# Patient Record
Sex: Male | Born: 1990 | Race: Black or African American | Hispanic: No | Marital: Single | Smoking: Never smoker
Health system: Southern US, Community
[De-identification: ages and names within clinical notes are randomized; demographics above are authoritative.]

## PROBLEM LIST (undated history)

## (undated) DIAGNOSIS — W3400XA Accidental discharge from unspecified firearms or gun, initial encounter: Secondary | ICD-10-CM

## (undated) DIAGNOSIS — Y249XXA Unspecified firearm discharge, undetermined intent, initial encounter: Secondary | ICD-10-CM

---
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
## 2012-03-30 IMAGING — CR DG FINGER THUMB 2+V*R*
3 series · 3 of 3 positions shown · non-contrast
Comparison: None

CLINICAL DATA: Right thumb laceration.  Question foreign body.

RIGHT THUMB 2+V

[x finger pa right]
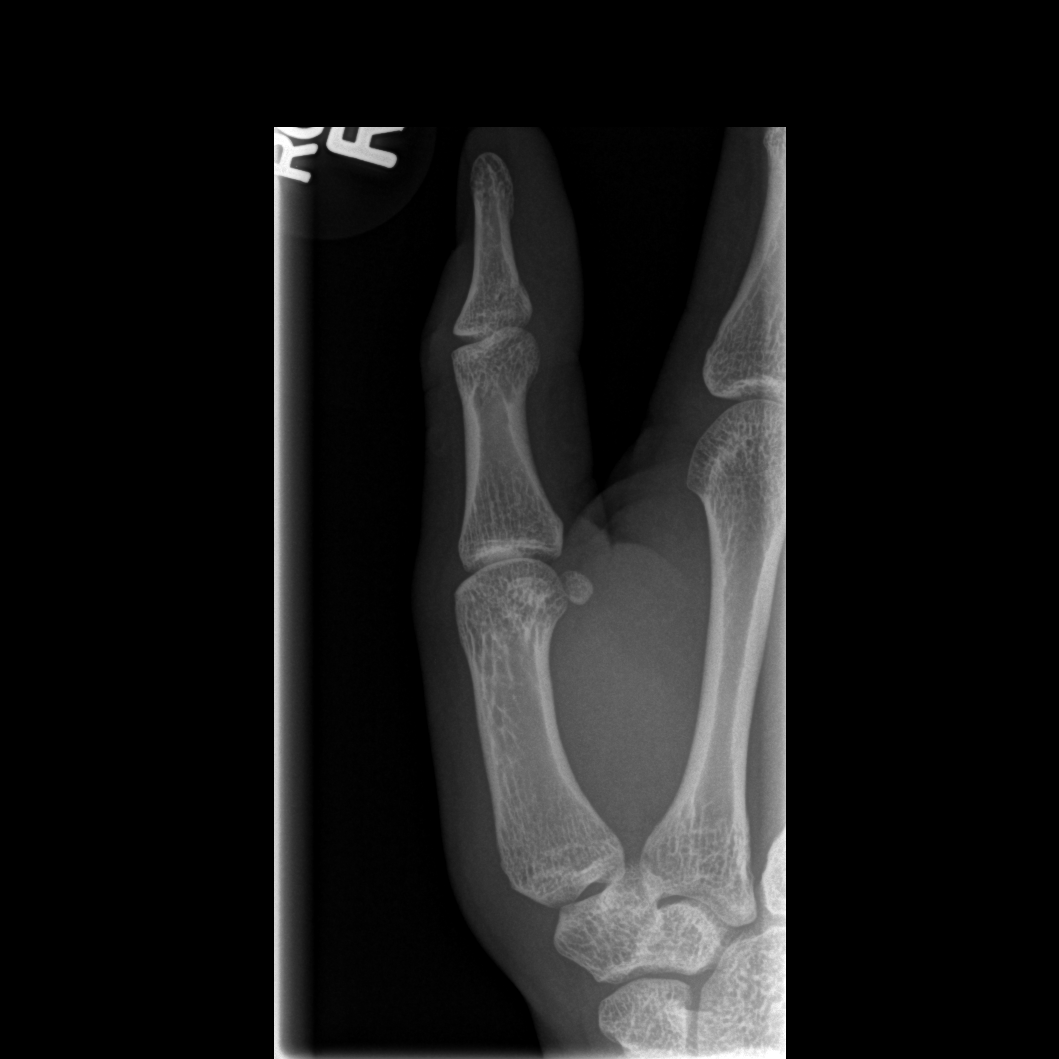

[x finger obl. right]
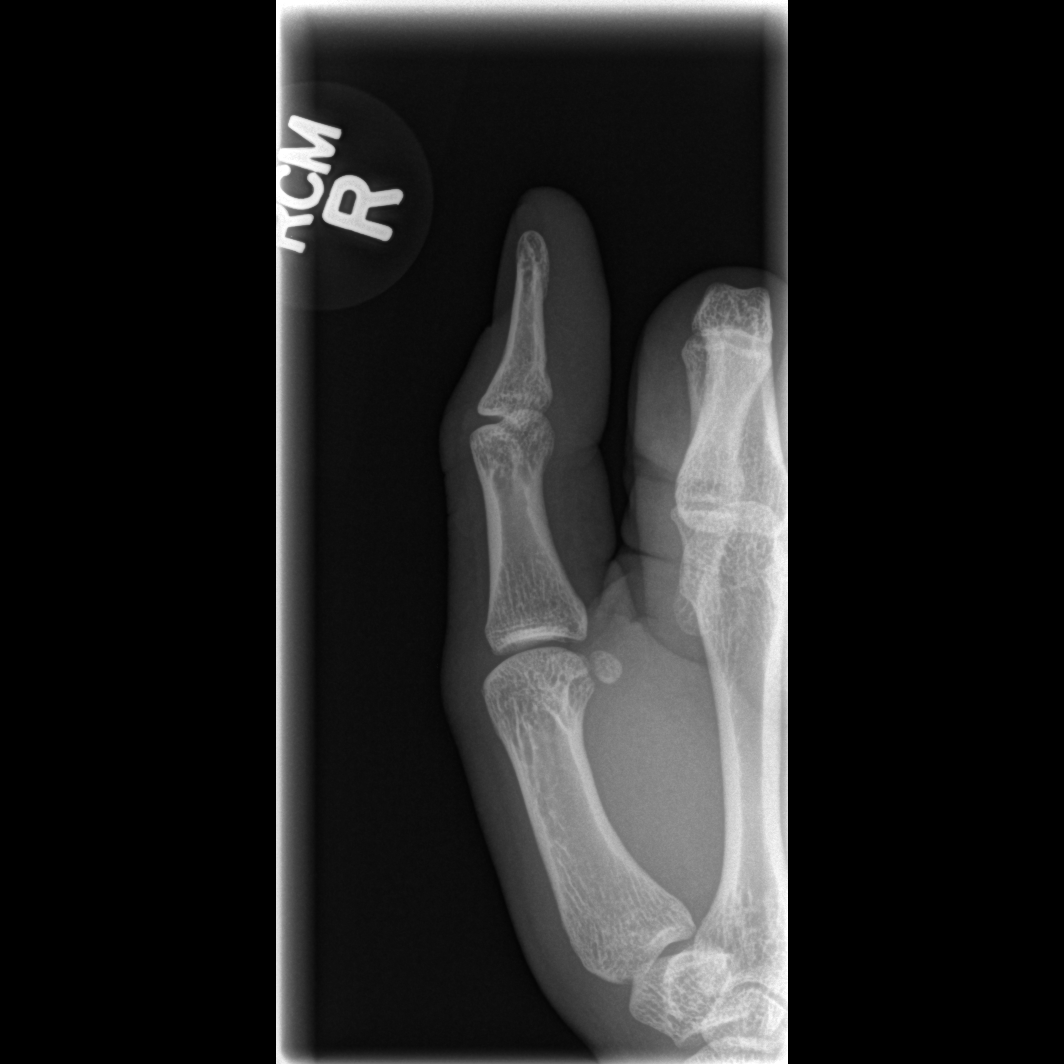

[x finger lateral right]
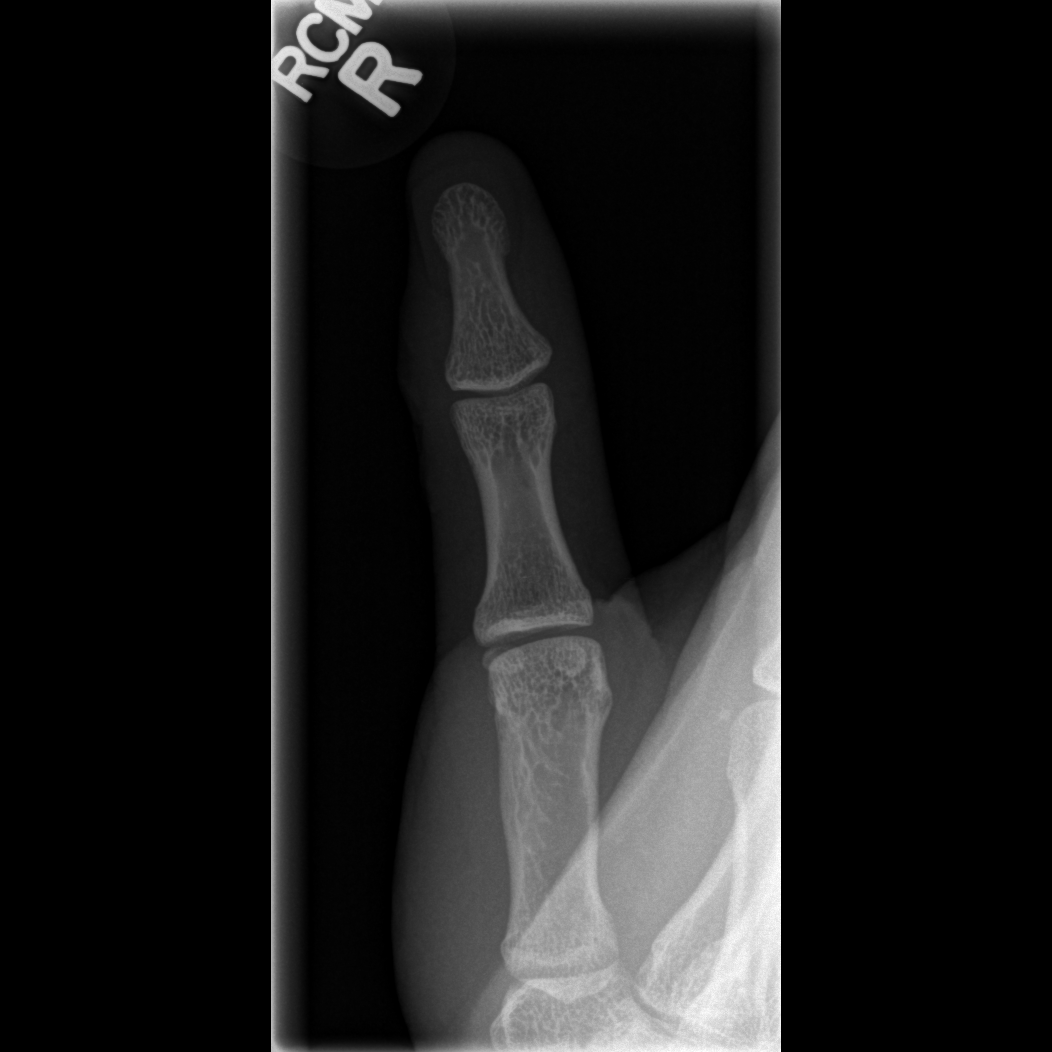

[3 of 3 positions shown; findings below may reference images not displayed]

FINDINGS: No acute bony abnormality.  Specifically, no fracture,
subluxation, or dislocation.  Soft tissues are intact.  No
radiopaque foreign bodies.
IMPRESSION: No fracture or foreign body visualized.

## 2012-11-28 ENCOUNTER — Encounter (HOSPITAL_COMMUNITY): Payer: Self-pay | Admitting: *Deleted

## 2012-11-28 ENCOUNTER — Emergency Department (INDEPENDENT_AMBULATORY_CARE_PROVIDER_SITE_OTHER)
Admission: EM | Admit: 2012-11-28 | Discharge: 2012-11-28 | Disposition: A | Discharge status: Home / Self Care | Payer: Medicaid Other | Source: Home / Self Care

## 2012-11-28 DIAGNOSIS — B86 Scabies: Secondary | ICD-10-CM

## 2012-11-28 MED ORDER — PERMETHRIN 1 % EX LOTN: TOPICAL_LOTION | Freq: Once | CUTANEOUS | Status: DC

## 2012-11-28 NOTE — ED Notes (Signed)
Pt  Reports  He  Noticed   2  Bumps  On  His  Penis  About  4  Days   -  When  Asked  If  Any  Discharge  Or  Burning  From penis  He   Denied

## 2012-11-28 NOTE — ED Provider Notes (Signed)
History     CSN: [id number]  Arrival date & time 11/28/12  1248   First MD Initiated Contact with Patient 11/28/12 1321      Chief Complaint  Patient presents with  . Sore    (Consider location/radiation/quality/duration/timing/severity/associated sxs/prior treatment) HPI This is a 22 y/o who presents with a rash on his penis that started yesterday. When examine him, he notes that it is present on other parts of the body as well - on his thigh, left wrist and in between his fingers. It is extremely itchy. He has not urethral discharge and has a stable girlfriend. He has recently been to prison.    History reviewed. No pertinent past medical history.  History reviewed. No pertinent past surgical history.  History reviewed. No pertinent family history.  History  Substance Use Topics  . Smoking status: Current Every Day Smoker  . Smokeless tobacco: Not on file  . Alcohol Use: Yes      Review of Systems  Constitutional: Negative.   HENT: Negative.   Eyes: Negative.   Respiratory: Positive for cough. Negative for apnea, choking, chest tightness, shortness of breath, wheezing and stridor.   Cardiovascular: Negative.   Gastrointestinal: Negative.   Genitourinary: Negative.   Musculoskeletal: Negative.   Skin: Negative.        Per HPI  Neurological: Negative.   Hematological: Negative.   Psychiatric/Behavioral: Negative.     Allergies  Review of patient's allergies indicates no known allergies.  Home Medications   Current Outpatient Rx  Name  Route  Sig  Dispense  Refill  . permethrin (ELIMITE) 1 % lotion   Topical   Apply topically once. Shampoo, rinse and towel dry hair, saturate hair and scalp with permethrin. Rinse after 10 min; repeat in 1 week if needed   59 mL   0     BP 138/72  Pulse 80  Temp(Src) 98.7 F (37.1 C) (Oral)  Resp 16  SpO2 97%  Physical Exam  Constitutional: He is oriented to person, place, and time. He appears well-developed and  well-nourished.  HENT:  Head: Normocephalic and atraumatic.  Eyes: Conjunctivae are normal.  Neck: Normal range of motion. Neck supple.  Cardiovascular: Normal rate and regular rhythm.   Pulmonary/Chest: Effort normal and breath sounds normal.  Abdominal: Soft. Bowel sounds are normal.  Musculoskeletal: Normal range of motion.  Neurological: He is alert and oriented to person, place, and time.  Skin: Skin is warm and dry.  Faint rash which appears to be pinpoint with crusting in some spots. 2 noted on penis, a few on right thigh, left wrist and interdigital area in between first and second fingers of left hand.     ED Course  Procedures (including critical care time)  Labs Reviewed - No data to display No results found.   1. Scabies       MDM  Permethrin ointment prescribed with advice to return if it does not resolve- general patient information regarding scabies and its management provided as well.         Calvert Cantor, MD 11/28/12 [phone]

## 2012-11-30 ENCOUNTER — Telehealth (HOSPITAL_COMMUNITY): Payer: Self-pay | Admitting: *Deleted

## 2012-11-30 NOTE — ED Notes (Signed)
Pharmacist called and said the doctor (Dr. Butler Denmark) ordered the 1 % Elimite and that is like Nix OTC with the shampoo instructions.  Chart reviewed and patient had scabies all over the body.  Discussed with Dr. Artis Flock and he changed it to the 5% Elimite cream 60 gm tube with 1 refill. Pharmacist notified if pt. and cover his body with 1/2 the tube for first dose and the other half 1 week later, they won't need to refill.  If they need the whole tube to cover the body then he can use the refill.  She voiced understanding and will explain to the pt. Kerry Harvey 11/30/2012

## 2021-08-10 ENCOUNTER — Emergency Department (HOSPITAL_COMMUNITY)
Admission: EM | Admit: 2021-08-10 | Discharge: 2021-08-11 | Disposition: A | Discharge status: Home / Self Care | Payer: Self-pay | Attending: Emergency Medicine | Admitting: Emergency Medicine

## 2021-08-10 DIAGNOSIS — U071 COVID-19: Secondary | ICD-10-CM | POA: Insufficient documentation

## 2021-08-10 DIAGNOSIS — Z20822 Contact with and (suspected) exposure to covid-19: Secondary | ICD-10-CM

## 2021-08-10 DIAGNOSIS — F17200 Nicotine dependence, unspecified, uncomplicated: Secondary | ICD-10-CM | POA: Insufficient documentation

## 2021-08-11 ENCOUNTER — Other Ambulatory Visit: Payer: Self-pay

## 2021-08-11 ENCOUNTER — Encounter (HOSPITAL_COMMUNITY): Payer: Self-pay | Admitting: *Deleted

## 2021-08-11 LAB — RESP PANEL BY RT-PCR (FLU A&B, COVID) ARPGX2
Influenza A by PCR: NEGATIVE
Influenza B by PCR: NEGATIVE
SARS Coronavirus 2 by RT PCR: POSITIVE — AB

## 2021-08-11 NOTE — ED Provider Notes (Signed)
[facility] EMERGENCY DEPARTMENT Provider Note   CSN: [id number] Arrival date & time: 08/10/21  2322     History CC: wants covid test   Kerry Harvey is a 30 y.o. male.  The history is provided by the patient and medical records.   30 y.o. M here with body aches.  Had a positive home test but wanted confirmatory test to be sure it is "on file".  Also needs work note.  No past medical history on file.  There are no problems to display for this patient.   No past surgical history on file.     No family history on file.  Social History   Tobacco Use   Smoking status: Every Day  Substance Use Topics   Alcohol use: Yes    Home Medications Prior to Admission medications   Medication Sig Start Date End Date Taking? Authorizing Provider  permethrin (ELIMITE) 1 % lotion Apply topically once. Shampoo, rinse and towel dry hair, saturate hair and scalp with permethrin. Rinse after 10 min; repeat in 1 week if needed 11/28/12   Calvert Cantor, MD    Allergies    Patient has no known allergies.  Review of Systems   Review of Systems  Musculoskeletal:  Positive for myalgias.  All other systems reviewed and are negative.  Physical Exam Updated Vital Signs BP 124/78    Pulse 71    Temp 98.3 F (36.8 C) (Oral)    Resp 13    SpO2 100%   Physical Exam Vitals and nursing note reviewed.  Constitutional:      Appearance: He is well-developed.     Comments: Smells strongly of marijuana  HENT:     Head: Normocephalic and atraumatic.  Eyes:     Conjunctiva/sclera: Conjunctivae normal.     Pupils: Pupils are equal, round, and reactive to light.  Cardiovascular:     Rate and Rhythm: Normal rate and regular rhythm.     Heart sounds: Normal heart sounds.  Pulmonary:     Effort: Pulmonary effort is normal. No respiratory distress.     Breath sounds: Normal breath sounds. No rhonchi.  Musculoskeletal:        General: Normal range of motion.     Cervical  back: Normal range of motion.  Skin:    General: Skin is warm and dry.  Neurological:     Mental Status: He is alert and oriented to person, place, and time.    ED Results / Procedures / Treatments   Labs (all labs ordered are listed, but only abnormal results are displayed) Labs Reviewed - No data to display  EKG None  Radiology No results found.  Procedures Procedures   Medications Ordered in ED Medications - No data to display  ED Course  I have reviewed the triage vital signs and the nursing notes.  Pertinent labs & imaging results that were available during my care of the patient were reviewed by me and considered in my medical decision making (see chart for details).    MDM Rules/Calculators/A&P                         30 year old male here requesting formal COVID test.  Had home test that was positive, also needs note for work.  His vitals are stable and he is overall well-appearing.  He smells incredibly strong of marijuana.  PCR was sent, given work note.  Given quarantine instructions as this is  likely going to be positive given home test was positive.  Can return here for new concerns.  Final Clinical Impression(s) / ED Diagnoses Final diagnoses:  Encounter for screening laboratory testing for COVID-19 virus    Rx / DC Orders ED Discharge Orders     None        Garlon Hatchet, PA-C 08/11/21 0013    Gilda Crease, MD 08/11/21 [phone]

## 2021-08-11 NOTE — ED Triage Notes (Signed)
Home covid test positive. Wants note for work.

## 2021-08-13 ENCOUNTER — Other Ambulatory Visit: Payer: Self-pay

## 2021-08-13 ENCOUNTER — Encounter (HOSPITAL_BASED_OUTPATIENT_CLINIC_OR_DEPARTMENT_OTHER): Payer: Self-pay | Admitting: Emergency Medicine

## 2021-08-13 ENCOUNTER — Emergency Department (HOSPITAL_BASED_OUTPATIENT_CLINIC_OR_DEPARTMENT_OTHER)
Admission: EM | Admit: 2021-08-13 | Discharge: 2021-08-13 | Disposition: A | Discharge status: Home / Self Care | Payer: Self-pay | Attending: Emergency Medicine | Admitting: Emergency Medicine

## 2021-08-13 DIAGNOSIS — F17200 Nicotine dependence, unspecified, uncomplicated: Secondary | ICD-10-CM | POA: Insufficient documentation

## 2021-08-13 DIAGNOSIS — U071 COVID-19: Secondary | ICD-10-CM | POA: Insufficient documentation

## 2021-08-13 MED ORDER — ALBUTEROL SULFATE HFA 108 (90 BASE) MCG/ACT IN AERS
2.0000 | INHALATION_SPRAY | Freq: Once | RESPIRATORY_TRACT | Status: AC
  Administered 2021-08-13: 2 via RESPIRATORY_TRACT
  Filled 2021-08-13: qty 6.7

## 2021-08-13 MED ORDER — AEROCHAMBER PLUS FLO-VU LARGE MISC
1.0000 | Freq: Once | Status: AC
  Administered 2021-08-13: 1
  Filled 2021-08-13: qty 1

## 2021-08-13 NOTE — ED Notes (Signed)
Pt educated on proper use of MDI/spacer. Pt verbalized understanding.

## 2021-08-13 NOTE — Discharge Instructions (Addendum)
You can take 1-2 puff of the inhaler every 4-6 hours as needed for shortness of breath, wheezing, or chest tightness.

## 2021-08-13 NOTE — ED Triage Notes (Signed)
°  Patient comes in stating that he is having difficulty breathing.  Patient was seen and tested positive for Covid on 12/18.  No pain but feels short of breath when being active.  Patient states he has not been taking any medications at home.  No hx of asthma.  Does smoke cigarettes/marijuana.  No pain at this time.

## 2021-08-13 NOTE — ED Provider Notes (Signed)
[facility] EMERGENCY DEPT Provider Note  CSN: [id number] Arrival date & time: 08/13/21 2307  Chief Complaint(s) Shortness of Breath  HPI Kerry Harvey is a 30 y.o. male diagnosed with COVID on 12/18 here with intermittent SOB   Shortness of Breath Severity:  Moderate Onset quality:  Gradual Timing:  Intermittent Progression:  Waxing and waning Chronicity:  New Context comment:  COVID + Relieved by:  Inhaler Worsened by:  Nothing Associated symptoms: cough   Associated symptoms: no chest pain, no fever, no headaches, no sputum production, no vomiting and no wheezing    Past Medical History History reviewed. No pertinent past medical history. There are no problems to display for this patient.  Home Medication(s) Prior to Admission medications   Medication Sig Start Date End Date Taking? Authorizing Provider  permethrin (ELIMITE) 1 % lotion Apply topically once. Shampoo, rinse and towel dry hair, saturate hair and scalp with permethrin. Rinse after 10 min; repeat in 1 week if needed 11/28/12   Calvert Cantor, MD                                                                                                                                    Past Surgical History History reviewed. No pertinent surgical history. Family History History reviewed. No pertinent family history.  Social History Social History   Tobacco Use   Smoking status: Every Day  Substance Use Topics   Alcohol use: Yes   Allergies Patient has no known allergies.  Review of Systems Review of Systems  Constitutional:  Negative for fever.  Respiratory:  Positive for cough and shortness of breath. Negative for sputum production and wheezing.   Cardiovascular:  Negative for chest pain.  Gastrointestinal:  Negative for vomiting.  Neurological:  Negative for headaches.  All other systems are reviewed and are negative for acute change except as noted in the HPI  Physical Exam Vital Signs   I have reviewed the triage vital signs BP 131/90 (BP Location: Right Arm)    Pulse 67    Temp 98.1 F (36.7 C) (Oral)    Resp 18    Ht 5\' 9"  (1.753 m)    Wt 68 kg    SpO2 100%    BMI 22.15 kg/m   Physical Exam Vitals reviewed.  Constitutional:      General: He is not in acute distress.    Appearance: He is well-developed. He is not diaphoretic.  HENT:     Head: Normocephalic and atraumatic.     Nose: Nose normal.  Eyes:     General: No scleral icterus.       Right eye: No discharge.        Left eye: No discharge.     Conjunctiva/sclera: Conjunctivae normal.     Pupils: Pupils are equal, round, and reactive to light.  Cardiovascular:     Rate and Rhythm: Normal rate and regular rhythm.  Heart sounds: No murmur heard.   No friction rub. No gallop.  Pulmonary:     Effort: Pulmonary effort is normal. No respiratory distress.     Breath sounds: Normal breath sounds. No stridor. No rales.  Abdominal:     General: There is no distension.     Palpations: Abdomen is soft.     Tenderness: There is no abdominal tenderness.  Musculoskeletal:        General: No tenderness.     Cervical back: Normal range of motion and neck supple.  Skin:    General: Skin is warm and dry.     Findings: No erythema or rash.  Neurological:     Mental Status: He is alert and oriented to person, place, and time.    ED Results and Treatments Labs (all labs ordered are listed, but only abnormal results are displayed) Labs Reviewed - No data to display                                                                                                                       EKG  EKG Interpretation  Date/Time:    Ventricular Rate:    PR Interval:    QRS Duration:   QT Interval:    QTC Calculation:   R Axis:     Text Interpretation:         Radiology No results found.  Pertinent labs & imaging results that were available during my care of the patient were reviewed by me and considered in my  medical decision making (see MDM for details).  Medications Ordered in ED Medications  albuterol (VENTOLIN HFA) 108 (90 Base) MCG/ACT inhaler 2 puff (2 puffs Inhalation Given 08/13/21 2349)  AeroChamber Plus Flo-Vu Large MISC 1 each (1 each Other Given 08/13/21 2350)                                                                                                                                     Procedures Procedures  (including critical care time)  Medical Decision Making / ED Course I have reviewed the nursing notes for this encounter and the patient's prior records (if available in EHR or on provided paperwork).  Kerry Harvey was evaluated in Emergency Department on 08/14/2021 for the symptoms described in the history of present illness. He was evaluated in the context of the global COVID-19 pandemic, which necessitated consideration that the  patient might be at risk for infection with the SARS-CoV-2 virus that causes COVID-19. Institutional protocols and algorithms that pertain to the evaluation of patients at risk for COVID-19 are in a state of rapid change based on information released by regulatory bodies including the CDC and federal and state organizations. These policies and algorithms were followed during the patient's care in the ED.     No respiratory distress. Sating well on RA. Lungs clear and equal. Currently asymptomatic.  Low suspicion for PNA, PTx, PE. Given albuterol for symptomatic relief as needed. Recommended smoking cessation.  Pertinent labs & imaging results that were available during my care of the patient were reviewed by me and considered in my medical decision making:    Final Clinical Impression(s) / ED Diagnoses Final diagnoses:  COVID-19 virus infection   The patient appears reasonably screened and/or stabilized for discharge and I doubt any other medical condition or other [facility] requiring further screening, evaluation, or treatment in the ED at  this time prior to discharge. Safe for discharge with strict return precautions.  Disposition: Discharge  Condition: Good  I have discussed the results, Dx and Tx plan with the patient/family who expressed understanding and agree(s) with the plan. Discharge instructions discussed at length. The patient/family was given strict return precautions who verbalized understanding of the instructions. No further questions at time of discharge.    ED Discharge Orders     None      Follow Up: Primary care provider  Call  to schedule an appointment for close follow up     This chart was dictated using voice recognition software.  Despite best efforts to proofread,  errors can occur which can change the documentation meaning.    Nira Conn, MD 08/14/21 [phone]

## 2021-08-25 ENCOUNTER — Other Ambulatory Visit: Payer: Self-pay

## 2021-08-25 ENCOUNTER — Encounter (HOSPITAL_BASED_OUTPATIENT_CLINIC_OR_DEPARTMENT_OTHER): Payer: Self-pay | Admitting: Obstetrics and Gynecology

## 2021-08-25 DIAGNOSIS — Z5321 Procedure and treatment not carried out due to patient leaving prior to being seen by health care provider: Secondary | ICD-10-CM | POA: Insufficient documentation

## 2021-08-25 DIAGNOSIS — R112 Nausea with vomiting, unspecified: Secondary | ICD-10-CM | POA: Insufficient documentation

## 2021-08-25 LAB — COMPREHENSIVE METABOLIC PANEL
ALT: 15 U/L (ref 0–44)
AST: 20 U/L (ref 15–41)
Albumin: 4.8 g/dL (ref 3.5–5.0)
Alkaline Phosphatase: 51 U/L (ref 38–126)
Anion gap: 10 (ref 5–15)
BUN: 15 mg/dL (ref 6–20)
CO2: 24 mmol/L (ref 22–32)
Calcium: 9.5 mg/dL (ref 8.9–10.3)
Chloride: 103 mmol/L (ref 98–111)
Creatinine, Ser: 0.87 mg/dL (ref 0.61–1.24)
GFR, Estimated: >60 mL/min (ref >60)
Glucose, Bld: 111 mg/dL — ABNORMAL HIGH (ref 70–99)
Potassium: 3.9 mmol/L (ref 3.5–5.1)
Sodium: 137 mmol/L (ref 135–145)
Total Bilirubin: 1 mg/dL (ref 0.3–1.2)
Total Protein: 8.2 g/dL — ABNORMAL HIGH (ref 6.5–8.1)

## 2021-08-25 LAB — CBC
HCT: 50.3 % (ref 39.0–52.0)
Hemoglobin: 16.9 g/dL (ref 13.0–17.0)
MCH: 28.7 pg (ref 26.0–34.0)
MCHC: 33.6 g/dL (ref 30.0–36.0)
MCV: 85.5 fL (ref 80.0–100.0)
Platelets: 240 10*3/uL (ref 150–400)
RBC: 5.88 MIL/uL — ABNORMAL HIGH (ref 4.22–5.81)
RDW: 12.4 % (ref 11.5–15.5)
WBC: 11 10*3/uL — ABNORMAL HIGH (ref 4.0–10.5)
nRBC: 0 % (ref 0.0–0.2)

## 2021-08-25 LAB — LIPASE, BLOOD: Lipase: 14 U/L (ref 11–51)

## 2021-08-25 MED ORDER — ONDANSETRON 4 MG PO TBDP
4.0000 mg | ORAL_TABLET | Freq: Once | ORAL | Status: DC | PRN
  Filled 2021-08-25: qty 1

## 2021-08-25 NOTE — ED Triage Notes (Signed)
Patient reports to the ER for nausea and emesis. Patient reports he recently had COVID 12/18 and today at 0800 started having emesis. Patient reports all food and fluid seems to come up or he has diarrhea.

## 2021-08-26 ENCOUNTER — Emergency Department (HOSPITAL_BASED_OUTPATIENT_CLINIC_OR_DEPARTMENT_OTHER)
Admission: EM | Admit: 2021-08-26 | Discharge: 2021-08-26 | Discharge status: Against Medical Advice | Payer: Self-pay | Attending: Emergency Medicine | Admitting: Emergency Medicine

## 2021-08-26 LAB — URINALYSIS, ROUTINE W REFLEX MICROSCOPIC
Bilirubin Urine: NEGATIVE
Glucose, UA: NEGATIVE mg/dL
Ketones, ur: 40 mg/dL — AB
Leukocytes,Ua: NEGATIVE
Nitrite: NEGATIVE
Protein, ur: 30 mg/dL — AB
Specific Gravity, Urine: 1.036 — ABNORMAL HIGH (ref 1.005–1.030)
pH: 5.5 (ref 5.0–8.0)

## 2023-02-10 ENCOUNTER — Emergency Department (HOSPITAL_COMMUNITY): Payer: BLUE CROSS/BLUE SHIELD | Admitting: Certified Registered"

## 2023-02-10 ENCOUNTER — Emergency Department (HOSPITAL_COMMUNITY): Payer: BLUE CROSS/BLUE SHIELD

## 2023-02-10 ENCOUNTER — Encounter (HOSPITAL_COMMUNITY): Admission: EM | Payer: Self-pay | Source: Home / Self Care

## 2023-02-10 ENCOUNTER — Other Ambulatory Visit: Payer: Self-pay

## 2023-02-10 ENCOUNTER — Inpatient Hospital Stay (HOSPITAL_COMMUNITY)
Admission: EM | Admit: 2023-02-10 | Discharge: 2023-02-16 | DRG: 958 | Payer: BLUE CROSS/BLUE SHIELD | Attending: General Surgery | Admitting: General Surgery

## 2023-02-10 ENCOUNTER — Encounter (HOSPITAL_COMMUNITY): Payer: Self-pay | Admitting: Emergency Medicine

## 2023-02-10 DIAGNOSIS — W3400XA Accidental discharge from unspecified firearms or gun, initial encounter: Secondary | ICD-10-CM

## 2023-02-10 DIAGNOSIS — S31139A Puncture wound of abdominal wall without foreign body, unspecified quadrant without penetration into peritoneal cavity, initial encounter: Principal | ICD-10-CM

## 2023-02-10 DIAGNOSIS — S36590A Other injury of ascending [right] colon, initial encounter: Principal | ICD-10-CM | POA: Diagnosis present

## 2023-02-10 DIAGNOSIS — S31633A Puncture wound without foreign body of abdominal wall, right lower quadrant with penetration into peritoneal cavity, initial encounter: Secondary | ICD-10-CM | POA: Diagnosis present

## 2023-02-10 DIAGNOSIS — S80211A Abrasion, right knee, initial encounter: Secondary | ICD-10-CM | POA: Diagnosis present

## 2023-02-10 DIAGNOSIS — S31634A Puncture wound without foreign body of abdominal wall, left lower quadrant with penetration into peritoneal cavity, initial encounter: Secondary | ICD-10-CM | POA: Diagnosis present

## 2023-02-10 DIAGNOSIS — N179 Acute kidney failure, unspecified: Secondary | ICD-10-CM | POA: Diagnosis present

## 2023-02-10 DIAGNOSIS — S32301A Unspecified fracture of right ilium, initial encounter for closed fracture: Secondary | ICD-10-CM | POA: Diagnosis present

## 2023-02-10 DIAGNOSIS — K567 Ileus, unspecified: Secondary | ICD-10-CM | POA: Diagnosis not present

## 2023-02-10 DIAGNOSIS — Z653 Problems related to other legal circumstances: Secondary | ICD-10-CM | POA: Diagnosis not present

## 2023-02-10 DIAGNOSIS — S36498A Other injury of other part of small intestine, initial encounter: Secondary | ICD-10-CM | POA: Diagnosis present

## 2023-02-10 DIAGNOSIS — D62 Acute posthemorrhagic anemia: Secondary | ICD-10-CM | POA: Diagnosis present

## 2023-02-10 DIAGNOSIS — Y249XXA Unspecified firearm discharge, undetermined intent, initial encounter: Secondary | ICD-10-CM

## 2023-02-10 HISTORY — PX: COLON RESECTION: SHX5231

## 2023-02-10 HISTORY — PX: BOWEL RESECTION: SHX1257

## 2023-02-10 HISTORY — PX: LAPAROTOMY: SHX154

## 2023-02-10 HISTORY — PX: SMALL BOWEL REPAIR: SHX6447

## 2023-02-10 LAB — I-STAT CHEM 8, ED
BUN: 14 mg/dL (ref 6–20)
Calcium, Ion: 1.07 mmol/L — ABNORMAL LOW (ref 1.15–1.40)
Chloride: 108 mmol/L (ref 98–111)
Creatinine, Ser: 1.1 mg/dL (ref 0.61–1.24)
Glucose, Bld: 153 mg/dL — ABNORMAL HIGH (ref 70–99)
HCT: 51 % (ref 39.0–52.0)
Hemoglobin: 17.3 g/dL — ABNORMAL HIGH (ref 13.0–17.0)
Potassium: 3.6 mmol/L (ref 3.5–5.1)
Sodium: 139 mmol/L (ref 135–145)
TCO2: 14 mmol/L — ABNORMAL LOW (ref 22–32)

## 2023-02-10 LAB — COMPREHENSIVE METABOLIC PANEL
ALT: 12 U/L (ref 0–44)
AST: 31 U/L (ref 15–41)
Albumin: 4.1 g/dL (ref 3.5–5.0)
Alkaline Phosphatase: 49 U/L (ref 38–126)
Anion gap: 27 — ABNORMAL HIGH (ref 5–15)
BUN: 13 mg/dL (ref 6–20)
CO2: 10 mmol/L — ABNORMAL LOW (ref 22–32)
Calcium: 8.9 mg/dL (ref 8.9–10.3)
Chloride: 102 mmol/L (ref 98–111)
Creatinine, Ser: 1.37 mg/dL — ABNORMAL HIGH (ref 0.61–1.24)
GFR, Estimated: >60 mL/min (ref >60)
Glucose, Bld: 158 mg/dL — ABNORMAL HIGH (ref 70–99)
Potassium: 3.6 mmol/L (ref 3.5–5.1)
Sodium: 139 mmol/L (ref 135–145)
Total Bilirubin: 1.4 mg/dL — ABNORMAL HIGH (ref 0.3–1.2)
Total Protein: 7.3 g/dL (ref 6.5–8.1)

## 2023-02-10 LAB — CBC
HCT: 50.3 % (ref 39.0–52.0)
HCT: 39.5 % (ref 39.0–52.0)
Hemoglobin: 16.6 g/dL (ref 13.0–17.0)
Hemoglobin: 13.5 g/dL (ref 13.0–17.0)
MCH: 30 pg (ref 26.0–34.0)
MCH: 30.8 pg (ref 26.0–34.0)
MCHC: 33 g/dL (ref 30.0–36.0)
MCHC: 34.2 g/dL (ref 30.0–36.0)
MCV: 91 fL (ref 80.0–100.0)
MCV: 90 fL (ref 80.0–100.0)
Platelets: 323 10*3/uL (ref 150–400)
Platelets: 214 10*3/uL (ref 150–400)
RBC: 5.53 MIL/uL (ref 4.22–5.81)
RBC: 4.39 MIL/uL (ref 4.22–5.81)
RDW: 12.5 % (ref 11.5–15.5)
RDW: 12.6 % (ref 11.5–15.5)
WBC: 7.7 10*3/uL (ref 4.0–10.5)
WBC: 8.6 10*3/uL (ref 4.0–10.5)
nRBC: 0 % (ref 0.0–0.2)
nRBC: 0 % (ref 0.0–0.2)

## 2023-02-10 LAB — POCT I-STAT 7, (LYTES, BLD GAS, ICA,H+H)
Acid-base deficit: 6 mmol/L — ABNORMAL HIGH (ref 0.0–2.0)
Bicarbonate: 19 mmol/L — ABNORMAL LOW (ref 20.0–28.0)
Calcium, Ion: 1.06 mmol/L — ABNORMAL LOW (ref 1.15–1.40)
HCT: 32 % — ABNORMAL LOW (ref 39.0–52.0)
Hemoglobin: 10.9 g/dL — ABNORMAL LOW (ref 13.0–17.0)
O2 Saturation: 100 %
Patient temperature: 34.7
Potassium: 4.2 mmol/L (ref 3.5–5.1)
Sodium: 139 mmol/L (ref 135–145)
TCO2: 20 mmol/L — ABNORMAL LOW (ref 22–32)
pCO2 arterial: 32.4 mmHg (ref 32–48)
pH, Arterial: 7.364 (ref 7.35–7.45)
pO2, Arterial: 357 mmHg — ABNORMAL HIGH (ref 83–108)

## 2023-02-10 LAB — PREPARE RBC (CROSSMATCH)

## 2023-02-10 LAB — TYPE AND SCREEN
ABO/RH(D): O POS
Unit division: 0

## 2023-02-10 LAB — HIV ANTIBODY (ROUTINE TESTING W REFLEX): HIV Screen 4th Generation wRfx: NONREACTIVE

## 2023-02-10 LAB — CREATININE, SERUM
Creatinine, Ser: 0.96 mg/dL (ref 0.61–1.24)
GFR, Estimated: >60 mL/min (ref >60)

## 2023-02-10 LAB — PROTIME-INR
INR: 1.3 — ABNORMAL HIGH (ref 0.8–1.2)
Prothrombin Time: 16.7 seconds — ABNORMAL HIGH (ref 11.4–15.2)

## 2023-02-10 LAB — ETHANOL: Alcohol, Ethyl (B): <10 mg/dL (ref <10)

## 2023-02-10 LAB — ABO/RH: ABO/RH(D): O POS

## 2023-02-10 SURGERY — LAPAROTOMY, EXPLORATORY
Anesthesia: General | Site: Abdomen

## 2023-02-10 MED ORDER — PIPERACILLIN-TAZOBACTAM 3.375 G IVPB: 3.3750 g | Freq: Three times a day (TID) | INTRAVENOUS | Status: DC

## 2023-02-10 MED ORDER — DEXMEDETOMIDINE HCL IN NACL 80 MCG/20ML IV SOLN
INTRAVENOUS | Status: DC | PRN
  Administered 2023-02-10: 10 ug via INTRAVENOUS
  Administered 2023-02-10: 8 ug via INTRAVENOUS
  Administered 2023-02-10: 10 ug via INTRAVENOUS

## 2023-02-10 MED ORDER — ROCURONIUM BROMIDE 10 MG/ML (PF) SYRINGE
PREFILLED_SYRINGE | INTRAVENOUS | Status: DC | PRN
  Administered 2023-02-10: 20 mg via INTRAVENOUS
  Administered 2023-02-10: 40 mg via INTRAVENOUS

## 2023-02-10 MED ORDER — FENTANYL CITRATE (PF) 100 MCG/2ML IJ SOLN
INTRAMUSCULAR | Status: AC
  Filled 2023-02-10: qty 2

## 2023-02-10 MED ORDER — SODIUM CHLORIDE 0.9 % IV SOLN: INTRAVENOUS | Status: DC

## 2023-02-10 MED ORDER — METRONIDAZOLE 500 MG/100ML IV SOLN
500.0000 mg | Freq: Once | INTRAVENOUS | Status: AC
  Administered 2023-02-10: 500 mg via INTRAVENOUS
  Filled 2023-02-10: qty 100

## 2023-02-10 MED ORDER — HYDROMORPHONE HCL 1 MG/ML IJ SOLN
0.5000 mg | INTRAMUSCULAR | Status: DC | PRN
  Administered 2023-02-10 – 2023-02-16 (×14): 1 mg via INTRAVENOUS
  Filled 2023-02-10 (×15): qty 1

## 2023-02-10 MED ORDER — FENTANYL CITRATE (PF) 250 MCG/5ML IJ SOLN
INTRAMUSCULAR | Status: AC
  Filled 2023-02-10: qty 5

## 2023-02-10 MED ORDER — ONDANSETRON HCL 4 MG/2ML IJ SOLN
INTRAMUSCULAR | Status: DC | PRN
  Administered 2023-02-10: 4 mg via INTRAVENOUS

## 2023-02-10 MED ORDER — OXYCODONE HCL 5 MG/5ML PO SOLN
5.0000 mg | Freq: Once | ORAL | Status: DC | PRN
Start: 2023-02-10 — End: 2023-02-10

## 2023-02-10 MED ORDER — DEXAMETHASONE SODIUM PHOSPHATE 10 MG/ML IJ SOLN
INTRAMUSCULAR | Status: DC | PRN
  Administered 2023-02-10: 10 mg via INTRAVENOUS

## 2023-02-10 MED ORDER — LACTATED RINGERS IV SOLN: INTRAVENOUS | Status: DC | PRN

## 2023-02-10 MED ORDER — ONDANSETRON HCL 4 MG/2ML IJ SOLN
4.0000 mg | Freq: Four times a day (QID) | INTRAMUSCULAR | Status: DC | PRN
  Administered 2023-02-11 – 2023-02-16 (×4): 4 mg via INTRAVENOUS
  Filled 2023-02-10 (×4): qty 2

## 2023-02-10 MED ORDER — OXYCODONE HCL 5 MG/5ML PO SOLN: 5.0000 mg | Freq: Once | ORAL | Status: DC | PRN

## 2023-02-10 MED ORDER — EPHEDRINE SULFATE-NACL 50-0.9 MG/10ML-% IV SOSY
PREFILLED_SYRINGE | INTRAVENOUS | Status: DC | PRN
  Administered 2023-02-10 (×2): 5 mg via INTRAVENOUS

## 2023-02-10 MED ORDER — SODIUM CHLORIDE 0.9% IV SOLUTION: Freq: Once | INTRAVENOUS | Status: DC

## 2023-02-10 MED ORDER — PROPOFOL 10 MG/ML IV BOLUS
INTRAVENOUS | Status: AC
  Filled 2023-02-10: qty 20

## 2023-02-10 MED ORDER — OXYCODONE HCL 5 MG PO TABS
5.0000 mg | ORAL_TABLET | ORAL | Status: DC | PRN
  Administered 2023-02-11: 10 mg via ORAL
  Administered 2023-02-11: 5 mg via ORAL
  Administered 2023-02-11 – 2023-02-15 (×18): 10 mg via ORAL
  Filled 2023-02-10 (×12): qty 2
  Filled 2023-02-10: qty 1
  Filled 2023-02-10 (×8): qty 2

## 2023-02-10 MED ORDER — OXYCODONE HCL 5 MG PO TABS
5.0000 mg | ORAL_TABLET | Freq: Once | ORAL | Status: DC | PRN
Start: 2023-02-10 — End: 2023-02-10

## 2023-02-10 MED ORDER — FENTANYL CITRATE PF 50 MCG/ML IJ SOSY
50.0000 ug | PREFILLED_SYRINGE | Freq: Once | INTRAMUSCULAR | Status: AC
  Filled 2023-02-10: qty 1

## 2023-02-10 MED ORDER — 0.9 % SODIUM CHLORIDE (POUR BTL) OPTIME
TOPICAL | Status: DC | PRN
  Administered 2023-02-10: 5000 mL

## 2023-02-10 MED ORDER — OXYCODONE HCL 5 MG PO TABS: 5.0000 mg | ORAL_TABLET | Freq: Once | ORAL | Status: DC | PRN

## 2023-02-10 MED ORDER — SUGAMMADEX SODIUM 200 MG/2ML IV SOLN
INTRAVENOUS | Status: DC | PRN
  Administered 2023-02-10: 200 mg via INTRAVENOUS

## 2023-02-10 MED ORDER — FENTANYL CITRATE (PF) 100 MCG/2ML IJ SOLN
25.0000 ug | INTRAMUSCULAR | Status: DC | PRN
  Administered 2023-02-10 (×3): 50 ug via INTRAVENOUS

## 2023-02-10 MED ORDER — LIDOCAINE 2% (20 MG/ML) 5 ML SYRINGE
INTRAMUSCULAR | Status: DC | PRN
  Administered 2023-02-10: 60 mg via INTRAVENOUS

## 2023-02-10 MED ORDER — PHENYLEPHRINE HCL-NACL 20-0.9 MG/250ML-% IV SOLN
INTRAVENOUS | Status: DC | PRN
  Administered 2023-02-10: 40 ug/min via INTRAVENOUS

## 2023-02-10 MED ORDER — METHOCARBAMOL 1000 MG/10ML IJ SOLN
500.0000 mg | Freq: Three times a day (TID) | INTRAVENOUS | Status: AC
  Filled 2023-02-10: qty 5

## 2023-02-10 MED ORDER — PIPERACILLIN-TAZOBACTAM 3.375 G IVPB
3.3750 g | Freq: Three times a day (TID) | INTRAVENOUS | Status: DC
  Administered 2023-02-10 – 2023-02-11 (×2): 3.375 g via INTRAVENOUS
  Filled 2023-02-10 (×5): qty 50

## 2023-02-10 MED ORDER — DOCUSATE SODIUM 100 MG PO CAPS
100.0000 mg | ORAL_CAPSULE | Freq: Two times a day (BID) | ORAL | Status: DC
  Administered 2023-02-10 – 2023-02-14 (×6): 100 mg via ORAL
  Filled 2023-02-10 (×9): qty 1

## 2023-02-10 MED ORDER — ENOXAPARIN SODIUM 30 MG/0.3ML IJ SOSY: 30.0000 mg | PREFILLED_SYRINGE | Freq: Two times a day (BID) | INTRAMUSCULAR | Status: DC

## 2023-02-10 MED ORDER — SUCCINYLCHOLINE CHLORIDE 200 MG/10ML IV SOSY
PREFILLED_SYRINGE | INTRAVENOUS | Status: DC | PRN
  Administered 2023-02-10: 120 mg via INTRAVENOUS

## 2023-02-10 MED ORDER — ONDANSETRON HCL 4 MG/2ML IJ SOLN: 4.0000 mg | Freq: Four times a day (QID) | INTRAMUSCULAR | Status: DC | PRN

## 2023-02-10 MED ORDER — FENTANYL CITRATE PF 50 MCG/ML IJ SOSY
PREFILLED_SYRINGE | INTRAMUSCULAR | Status: AC
  Administered 2023-02-10: 50 ug via INTRAVENOUS
  Filled 2023-02-10: qty 1

## 2023-02-10 MED ORDER — ALBUMIN HUMAN 5 % IV SOLN: INTRAVENOUS | Status: DC | PRN

## 2023-02-10 MED ORDER — LACTATED RINGERS IV SOLN: INTRAVENOUS | Status: DC

## 2023-02-10 MED ORDER — HYDRALAZINE HCL 20 MG/ML IJ SOLN: 10.0000 mg | INTRAMUSCULAR | Status: DC | PRN

## 2023-02-10 MED ORDER — GABAPENTIN 300 MG PO CAPS
300.0000 mg | ORAL_CAPSULE | Freq: Three times a day (TID) | ORAL | Status: DC
  Administered 2023-02-10 – 2023-02-15 (×16): 300 mg via ORAL
  Filled 2023-02-10 (×5): qty 1
  Filled 2023-02-10: qty 3
  Filled 2023-02-10 (×11): qty 1

## 2023-02-10 MED ORDER — FENTANYL CITRATE (PF) 100 MCG/2ML IJ SOLN: 25.0000 ug | INTRAMUSCULAR | Status: DC | PRN

## 2023-02-10 MED ORDER — POLYETHYLENE GLYCOL 3350 17 G PO PACK
17.0000 g | PACK | Freq: Every day | ORAL | Status: DC | PRN
  Administered 2023-02-13: 17 g via ORAL
  Filled 2023-02-10: qty 1

## 2023-02-10 MED ORDER — FENTANYL CITRATE (PF) 250 MCG/5ML IJ SOLN
INTRAMUSCULAR | Status: DC | PRN
  Administered 2023-02-10: 50 ug via INTRAVENOUS
  Administered 2023-02-10: 25 ug via INTRAVENOUS
  Administered 2023-02-10: 100 ug via INTRAVENOUS
  Administered 2023-02-10: 25 ug via INTRAVENOUS

## 2023-02-10 MED ORDER — METOPROLOL TARTRATE 5 MG/5ML IV SOLN: 5.0000 mg | Freq: Four times a day (QID) | INTRAVENOUS | Status: DC | PRN

## 2023-02-10 MED ORDER — HEMOSTATIC AGENTS (NO CHARGE) OPTIME
TOPICAL | Status: DC | PRN
  Administered 2023-02-10: 1

## 2023-02-10 MED ORDER — ACETAMINOPHEN 500 MG PO TABS
1000.0000 mg | ORAL_TABLET | Freq: Four times a day (QID) | ORAL | Status: DC
  Administered 2023-02-10 – 2023-02-16 (×20): 1000 mg via ORAL
  Filled 2023-02-10 (×22): qty 2

## 2023-02-10 MED ORDER — METHOCARBAMOL 500 MG PO TABS
500.0000 mg | ORAL_TABLET | Freq: Three times a day (TID) | ORAL | Status: AC
  Administered 2023-02-10 – 2023-02-13 (×9): 500 mg via ORAL
  Filled 2023-02-10 (×9): qty 1

## 2023-02-10 MED ORDER — IOHEXOL 350 MG/ML SOLN
75.0000 mL | Freq: Once | INTRAVENOUS | Status: AC | PRN
  Administered 2023-02-10: 75 mL via INTRAVENOUS

## 2023-02-10 MED ORDER — ONDANSETRON 4 MG PO TBDP: 4.0000 mg | ORAL_TABLET | Freq: Four times a day (QID) | ORAL | Status: DC | PRN

## 2023-02-10 MED ORDER — METRONIDAZOLE 500 MG/100ML IV SOLN
500.0000 mg | Freq: Once | INTRAVENOUS | Status: DC
  Filled 2023-02-10: qty 100

## 2023-02-10 MED ORDER — PROPOFOL 10 MG/ML IV BOLUS
INTRAVENOUS | Status: DC | PRN
  Administered 2023-02-10: 50 mg via INTRAVENOUS
  Administered 2023-02-10: 200 mg via INTRAVENOUS

## 2023-02-10 MED ORDER — ONDANSETRON HCL 4 MG/2ML IJ SOLN
4.0000 mg | Freq: Four times a day (QID) | INTRAMUSCULAR | Status: DC | PRN
Start: 2023-02-10 — End: 2023-02-10

## 2023-02-10 SURGICAL SUPPLY — 67 items
ADH SKN CLS APL DERMABOND .7 (GAUZE/BANDAGES/DRESSINGS) ×3
ADH SKNCLS APL OCTYL .7 VIOL (GAUZE/BANDAGES/DRESSINGS) ×3
APL PRP STRL LF DISP 70% ISPRP (MISCELLANEOUS) ×3
APPLIER CLIP 5 13 M/L LIGAMAX5 (MISCELLANEOUS)
APR CLP MED LRG 5 ANG JAW (MISCELLANEOUS)
BAG COUNTER SPONGE SURGICOUNT (BAG) ×4 IMPLANT
BAG SPNG CNTER NS LX DISP (BAG) ×3
BLADE CLIPPER SURG (BLADE) IMPLANT
CANISTER SUCT 3000ML PPV (MISCELLANEOUS) ×4 IMPLANT
CHLORAPREP W/TINT 26 (MISCELLANEOUS) ×4 IMPLANT
CLIP APPLIE 5 13 M/L LIGAMAX5 (MISCELLANEOUS) IMPLANT
COVER MAYO STAND STRL (DRAPES) ×1 IMPLANT
COVER SURGICAL LIGHT HANDLE (MISCELLANEOUS) ×4 IMPLANT
CUTTER FLEX LINEAR 45M (STAPLE) IMPLANT
DERMABOND ADVANCED .7 DNX12 (GAUZE/BANDAGES/DRESSINGS) ×4 IMPLANT
DRAPE HALF SHEET 40X57 (DRAPES) ×1 IMPLANT
DRSG OPSITE POSTOP 4X12 (GAUZE/BANDAGES/DRESSINGS) ×1 IMPLANT
ELECT REM PT RETURN 9FT ADLT (ELECTROSURGICAL) ×3
ELECTRODE REM PT RTRN 9FT ADLT (ELECTROSURGICAL) ×4 IMPLANT
ENDOLOOP SUT PDS II 0 18 (SUTURE) IMPLANT
GAUZE SPONGE 4X4 12PLY STRL (GAUZE/BANDAGES/DRESSINGS) ×1 IMPLANT
GLOVE BIOGEL PI MICRO STRL 5.5 (GLOVE) ×4 IMPLANT
GLOVE SURG UNDER POLY LF SZ6 (GLOVE) ×4 IMPLANT
GOWN STRL REUS W/ TWL LRG LVL3 (GOWN DISPOSABLE) ×12 IMPLANT
GOWN STRL REUS W/TWL LRG LVL3 (GOWN DISPOSABLE) ×9
HEMOSTAT SNOW SURGICEL 2X4 (HEMOSTASIS) ×1 IMPLANT
IRRIG SUCT STRYKERFLOW 2 WTIP (MISCELLANEOUS)
IRRIGATION SUCT STRKRFLW 2 WTP (MISCELLANEOUS) IMPLANT
KIT BASIN OR (CUSTOM PROCEDURE TRAY) ×4 IMPLANT
KIT TURNOVER KIT B (KITS) ×4 IMPLANT
LIGASURE IMPACT 36 18CM CVD LR (INSTRUMENTS) ×1 IMPLANT
NS IRRIG 1000ML POUR BTL (IV SOLUTION) ×4 IMPLANT
PAD ARMBOARD 7.5X6 YLW CONV (MISCELLANEOUS) ×8 IMPLANT
PENCIL BUTTON HOLSTER BLD 10FT (ELECTRODE) ×4 IMPLANT
RELOAD PROXIMATE 75MM BLUE (ENDOMECHANICALS) ×21 IMPLANT
RELOAD PROXIMATE TA60MM BLUE (ENDOMECHANICALS) ×3 IMPLANT
RELOAD STAPLE 45 3.5 BLU ETS (ENDOMECHANICALS) IMPLANT
RELOAD STAPLE 60 BLU REG PROX (ENDOMECHANICALS) IMPLANT
RELOAD STAPLE 75 3.8 BLU REG (ENDOMECHANICALS) IMPLANT
RELOAD STAPLE TA45 3.5 REG BLU (ENDOMECHANICALS) IMPLANT
RETRACTOR WND ALEXIS 25 LRG (MISCELLANEOUS) ×1 IMPLANT
RTRCTR WOUND ALEXIS 25CM LRG (MISCELLANEOUS) ×3
SCISSORS LAP 5X35 DISP (ENDOMECHANICALS) IMPLANT
SET TUBE SMOKE EVAC HIGH FLOW (TUBING) ×4 IMPLANT
SHEARS HARMONIC ACE PLUS 36CM (ENDOMECHANICALS) IMPLANT
SLEEVE Z-THREAD 5X100MM (TROCAR) ×4 IMPLANT
SPECIMEN JAR SMALL (MISCELLANEOUS) ×4 IMPLANT
SPONGE T-LAP 18X18 ~~LOC~~+RFID (SPONGE) ×4 IMPLANT
STAPLER GUN LINEAR PROX 60 (STAPLE) ×1 IMPLANT
STAPLER PROXIMATE 75MM BLUE (STAPLE) ×1 IMPLANT
SUT MNCRL AB 4-0 PS2 18 (SUTURE) ×3 IMPLANT
SUT PDS AB 1 TP1 96 (SUTURE) ×3 IMPLANT
SUT SILK 2 0 SH CR/8 (SUTURE) ×1 IMPLANT
SUT SILK 3 0 SH CR/8 (SUTURE) ×3 IMPLANT
SYS BAG RETRIEVAL 10MM (BASKET) ×3
SYSTEM BAG RETRIEVAL 10MM (BASKET) ×4 IMPLANT
TAPE CLOTH SURG 6X10 WHT LF (GAUZE/BANDAGES/DRESSINGS) ×1 IMPLANT
TOWEL GREEN STERILE (TOWEL DISPOSABLE) ×4 IMPLANT
TOWEL GREEN STERILE FF (TOWEL DISPOSABLE) ×4 IMPLANT
TRAY FOLEY MTR SLVR 16FR STAT (SET/KITS/TRAYS/PACK) ×1 IMPLANT
TRAY FOLEY W/BAG SLVR 14FR (SET/KITS/TRAYS/PACK) IMPLANT
TRAY LAPAROSCOPIC MC (CUSTOM PROCEDURE TRAY) ×4 IMPLANT
TROCAR BALLN 12MMX100 BLUNT (TROCAR) ×4 IMPLANT
TROCAR Z-THREAD OPTICAL 5X100M (TROCAR) ×4 IMPLANT
TUBE CONNECTING 12X1/4 (SUCTIONS) ×1 IMPLANT
WARMER LAPAROSCOPE (MISCELLANEOUS) ×4 IMPLANT
WATER STERILE IRR 1000ML POUR (IV SOLUTION) ×4 IMPLANT

## 2023-02-10 NOTE — Progress Notes (Signed)
Per [employer], no visitors at this time unless cleared by them.
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
## 2023-02-10 NOTE — Transfer of Care (Signed)
Immediate Anesthesia Transfer of Care Note  Patient: Kerry Harvey  Procedure(s) Performed: EXPLORATORY LAPAROTOMY SMALL BOWEL RESECTION (Abdomen) SMALL BOWEL REPAIR (Abdomen) COLON RESECTION WITH ANASTOMOSIS (Abdomen)  Patient Location: PACU  Anesthesia Type:General  Level of Consciousness: awake  Airway & Oxygen Therapy: Patient Spontanous Breathing  Post-op Assessment: Report given to RN and Post -op Vital signs reviewed and stable  Post vital signs: Reviewed and stable  Last Vitals:  Vitals Value Taken Time  BP 134/79 02/10/23 1508  Temp 36.6 C 02/10/23 1508  Pulse 98 02/10/23 1513  Resp 21 02/10/23 1513  SpO2 96 % 02/10/23 1513  Vitals shown include unvalidated device data.  Last Pain:  Vitals:   02/10/23 1508  TempSrc:   PainSc: 0-No pain         Complications: No notable events documented.

## 2023-02-10 NOTE — Anesthesia Procedure Notes (Signed)
Procedure Name: Intubation Date/Time: 02/10/2023 12:50 PM  Performed by: Cheree Ditto, CRNAPre-anesthesia Checklist: Patient identified, Emergency Drugs available, Suction available and Patient being monitored Patient Re-evaluated:Patient Re-evaluated prior to induction Oxygen Delivery Method: Circle system utilized Preoxygenation: Pre-oxygenation with 100% oxygen Induction Type: IV induction, Rapid sequence and Cricoid Pressure applied Laryngoscope Size: Mac and 4 Grade View: Grade I Tube type: Oral Tube size: 8.0 mm Number of attempts: 1 Airway Equipment and Method: Stylet and Oral airway Placement Confirmation: ETT inserted through vocal cords under direct vision, positive ETCO2 and breath sounds checked- equal and bilateral Secured at: 24 cm Tube secured with: Tape Dental Injury: Teeth and Oropharynx as per pre-operative assessment

## 2023-02-10 NOTE — Anesthesia Procedure Notes (Signed)
Central Venous Catheter Insertion Performed by: Achille Rich, MD, anesthesiologist Start/End6/19/2024 12:55 PM, 02/10/2023 1:02 PM Patient location: Pre-op. Emergency situation Preanesthetic checklist: patient identified, IV checked, site marked, risks and benefits discussed, surgical consent, monitors and equipment checked, pre-op evaluation, timeout performed and anesthesia consent Position: Trendelenburg Patient sedated Hand hygiene performed , maximum sterile barriers used  and Seldinger technique used Catheter size: 7 Fr Central line was placed.Double lumen Procedure performed using ultrasound guided technique. Ultrasound Notes:anatomy identified, needle tip was noted to be adjacent to the nerve/plexus identified, no ultrasound evidence of intravascular and/or intraneural injection and image(s) printed for medical record Attempts: 1 Following insertion, line sutured, dressing applied and Biopatch. Post procedure assessment: blood return through all ports, free fluid flow and no air  Patient tolerated the procedure well with no immediate complications.

## 2023-02-10 NOTE — Anesthesia Procedure Notes (Signed)
Arterial Line Insertion Start/End6/19/2024 12:55 PM, 02/10/2023 12:58 PM Performed by: Carlos American, CRNA, CRNA  Patient location: Pre-op. Preanesthetic checklist: patient identified, IV checked, site marked, risks and benefits discussed, surgical consent, monitors and equipment checked, pre-op evaluation, timeout performed and anesthesia consent Lidocaine 1% used for infiltration Left, radial was placed Catheter size: 20 G Hand hygiene performed  and maximum sterile barriers used   Attempts: 1 Procedure performed without using ultrasound guided technique. Following insertion, dressing applied and Biopatch. Post procedure assessment: normal and unchanged  Patient tolerated the procedure well with no immediate complications.
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
## 2023-02-10 NOTE — Anesthesia Preprocedure Evaluation (Signed)
Anesthesia Evaluation  Patient identified by MRN, date of birth, ID band Patient awake    Reviewed: Allergy & Precautions, H&P , NPO status , Patient's Chart, lab work & pertinent test resultsPreop documentation limited or incomplete due to emergent nature of procedure.  Airway Mallampati: I   Neck ROM: full    Dental   Pulmonary neg pulmonary ROS   breath sounds clear to auscultation       Cardiovascular negative cardio ROS  Rhythm:regular Rate:Normal     Neuro/Psych    GI/Hepatic   Endo/Other    Renal/GU      Musculoskeletal   Abdominal   Peds  Hematology   Anesthesia Other Findings   Reproductive/Obstetrics                             Anesthesia Physical Anesthesia Plan  ASA: 2 and emergent  Anesthesia Plan: General   Post-op Pain Management:    Induction: Intravenous  PONV Risk Score and Plan: 2 and Ondansetron, Dexamethasone, Midazolam and Treatment may vary due to age or medical condition  Airway Management Planned: Oral ETT  Additional Equipment: Arterial line, CVP and Ultrasound Guidance Line Placement  Intra-op Plan:   Post-operative Plan: Extubation in OR  Informed Consent: I have reviewed the patients History and Physical, chart, labs and discussed the procedure including the risks, benefits and alternatives for the proposed anesthesia with the patient or authorized representative who has indicated his/her understanding and acceptance.     Dental advisory given  Plan Discussed with: CRNA, Anesthesiologist and Surgeon  Anesthesia Plan Comments:        Anesthesia Quick Evaluation
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
## 2023-02-11 ENCOUNTER — Encounter (HOSPITAL_COMMUNITY): Payer: Self-pay | Admitting: Surgery

## 2023-02-11 LAB — CBC
HCT: 38.5 % — ABNORMAL LOW (ref 39.0–52.0)
Hemoglobin: 13.3 g/dL (ref 13.0–17.0)
MCH: 30.3 pg (ref 26.0–34.0)
MCHC: 34.5 g/dL (ref 30.0–36.0)
MCV: 87.7 fL (ref 80.0–100.0)
Platelets: 197 10*3/uL (ref 150–400)
RBC: 4.39 MIL/uL (ref 4.22–5.81)
RDW: 12.5 % (ref 11.5–15.5)
WBC: 11 10*3/uL — ABNORMAL HIGH (ref 4.0–10.5)
nRBC: 0 % (ref 0.0–0.2)

## 2023-02-11 LAB — BASIC METABOLIC PANEL
Anion gap: 9 (ref 5–15)
BUN: 6 mg/dL (ref 6–20)
CO2: 21 mmol/L — ABNORMAL LOW (ref 22–32)
Calcium: 7.7 mg/dL — ABNORMAL LOW (ref 8.9–10.3)
Chloride: 103 mmol/L (ref 98–111)
Creatinine, Ser: 0.84 mg/dL (ref 0.61–1.24)
GFR, Estimated: >60 mL/min (ref >60)
Glucose, Bld: 111 mg/dL — ABNORMAL HIGH (ref 70–99)
Potassium: 3.8 mmol/L (ref 3.5–5.1)
Sodium: 133 mmol/L — ABNORMAL LOW (ref 135–145)

## 2023-02-11 MED ORDER — CHLORHEXIDINE GLUCONATE CLOTH 2 % EX PADS
6.0000 | MEDICATED_PAD | Freq: Every day | CUTANEOUS | Status: DC
  Administered 2023-02-11 – 2023-02-15 (×5): 6 via TOPICAL

## 2023-02-11 MED ORDER — PIPERACILLIN-TAZOBACTAM 3.375 G IVPB
3.3750 g | Freq: Three times a day (TID) | INTRAVENOUS | Status: AC
  Administered 2023-02-11 – 2023-02-15 (×12): 3.375 g via INTRAVENOUS
  Filled 2023-02-11 (×12): qty 50
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
## 2023-02-11 NOTE — Progress Notes (Signed)
RN performed wound care on the patient he tolerated okay pain medication given. Honeycomb dressing changed because it was soiled.

## 2023-02-11 NOTE — Progress Notes (Signed)
PT Cancellation Note  Patient Details Name: Kerry Harvey MRN: [id number] DOB: 09-26-1990   Cancelled Treatment:    Reason Eval/Treat Not Completed: Patient declined, no reason specified  Pre-medicated, refuses PT evaluation today. He did get up briefly with OT earlier.  Educated on importance of early mobility to prevent complications of immobility during admission. Will try again tomorrow.   Kathlyn [place], PT, DPT [facility]  Rehabilitation Services Physical Therapist Office: [phone] Website: [email or web address]   Berton Mount 02/11/2023, 1:24 PM

## 2023-02-12 ENCOUNTER — Encounter (HOSPITAL_COMMUNITY): Payer: Self-pay | Admitting: Surgery

## 2023-02-12 LAB — CBC
HCT: 33.5 % — ABNORMAL LOW (ref 39.0–52.0)
HCT: 32.7 % — ABNORMAL LOW (ref 39.0–52.0)
Hemoglobin: 11.6 g/dL — ABNORMAL LOW (ref 13.0–17.0)
Hemoglobin: 11.2 g/dL — ABNORMAL LOW (ref 13.0–17.0)
MCH: 31 pg (ref 26.0–34.0)
MCH: 29.9 pg (ref 26.0–34.0)
MCHC: 34.6 g/dL (ref 30.0–36.0)
MCHC: 34.3 g/dL (ref 30.0–36.0)
MCV: 89.6 fL (ref 80.0–100.0)
MCV: 87.4 fL (ref 80.0–100.0)
Platelets: 157 10*3/uL (ref 150–400)
Platelets: 159 10*3/uL (ref 150–400)
RBC: 3.74 MIL/uL — ABNORMAL LOW (ref 4.22–5.81)
RBC: 3.74 MIL/uL — ABNORMAL LOW (ref 4.22–5.81)
RDW: 12.7 % (ref 11.5–15.5)
RDW: 12.5 % (ref 11.5–15.5)
WBC: 12.7 10*3/uL — ABNORMAL HIGH (ref 4.0–10.5)
WBC: 12.5 10*3/uL — ABNORMAL HIGH (ref 4.0–10.5)
nRBC: 0 % (ref 0.0–0.2)
nRBC: 0 % (ref 0.0–0.2)

## 2023-02-12 LAB — TYPE AND SCREEN
Antibody Screen: NEGATIVE
Unit division: 0

## 2023-02-12 LAB — BPAM RBC
Blood Product Expiration Date: 202407142359
Blood Product Expiration Date: 202407282359
ISSUE DATE / TIME: 202406191218
ISSUE DATE / TIME: 202406191218
Unit Type and Rh: 5100
Unit Type and Rh: 5100

## 2023-02-12 LAB — BASIC METABOLIC PANEL
Anion gap: 8 (ref 5–15)
BUN: 6 mg/dL (ref 6–20)
CO2: 23 mmol/L (ref 22–32)
Calcium: 8.1 mg/dL — ABNORMAL LOW (ref 8.9–10.3)
Chloride: 102 mmol/L (ref 98–111)
Creatinine, Ser: 0.99 mg/dL (ref 0.61–1.24)
GFR, Estimated: >60 mL/min (ref >60)
Glucose, Bld: 90 mg/dL (ref 70–99)
Potassium: 3.7 mmol/L (ref 3.5–5.1)
Sodium: 133 mmol/L — ABNORMAL LOW (ref 135–145)

## 2023-02-12 LAB — SURGICAL PATHOLOGY

## 2023-02-12 MED ORDER — SILVER NITRATE-POT NITRATE 75-25 % EX MISC
1.0000 | Freq: Once | CUTANEOUS | Status: AC
  Administered 2023-02-12: 1 via TOPICAL
  Filled 2023-02-12: qty 1

## 2023-02-12 MED ORDER — ENOXAPARIN SODIUM 30 MG/0.3ML IJ SOSY
30.0000 mg | PREFILLED_SYRINGE | Freq: Two times a day (BID) | INTRAMUSCULAR | Status: DC
  Administered 2023-02-13 – 2023-02-15 (×5): 30 mg via SUBCUTANEOUS
  Filled 2023-02-12 (×6): qty 0.3

## 2023-02-12 NOTE — Anesthesia Postprocedure Evaluation (Signed)
Anesthesia Post Note  Patient: Kerry Harvey  Procedure(s) Performed: EXPLORATORY LAPAROTOMY SMALL BOWEL RESECTION (Abdomen) SMALL BOWEL REPAIR (Abdomen) COLON RESECTION WITH ANASTOMOSIS (Abdomen)     Patient location during evaluation: PACU Anesthesia Type: General Level of consciousness: awake and alert Pain management: pain level controlled Vital Signs Assessment: post-procedure vital signs reviewed and stable Respiratory status: spontaneous breathing, nonlabored ventilation, respiratory function stable and patient connected to nasal cannula oxygen Cardiovascular status: blood pressure returned to baseline and stable Postop Assessment: no apparent nausea or vomiting Anesthetic complications: no   No notable events documented.  Last Vitals:  Vitals:   02/11/23 2024 02/12/23 0455  BP: (!) 145/72 116/65  Pulse: 99 94  Resp: 18 16  Temp: 36.6 C 37.1 C  SpO2: 97% 97%    Last Pain:  Vitals:   02/12/23 0521  TempSrc:   PainSc: 7                  [name] S
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
## 2023-02-12 NOTE — TOC CAGE-AID Note (Signed)
TRN Cage Aid Note   Patient Details  Name: LEONIDUS ROWAND MRN: [id number] Date of Birth: 1991/03/23    Judie Bonus, RN Phone Number: 02/12/2023, 6:59 AM   Clinical Narrative:  Pt reports etoh usage and THC usage "like snoop dog", pt declined to answer if this was daily or occasionally.  Pt denies tobacco usage. Pt declines the need for resources at this time.    CAGE-AID Screening:    Have You Ever Felt You Ought to Cut Down on Your Drinking or Drug Use?: No Have People Annoyed You By Critizing Your Drinking Or Drug Use?: No Have You Felt Bad Or Guilty About Your Drinking Or Drug Use?: No Have You Ever Had a Drink or Used Drugs First Thing In The Morning to Steady Your Nerves or to Get Rid of a Hangover?: No CAGE-AID Score: 0  Substance Abuse Education Offered: Yes
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
## 2023-02-13 LAB — BASIC METABOLIC PANEL
Anion gap: 11 (ref 5–15)
BUN: 6 mg/dL (ref 6–20)
CO2: 25 mmol/L (ref 22–32)
Calcium: 8.3 mg/dL — ABNORMAL LOW (ref 8.9–10.3)
Chloride: 98 mmol/L (ref 98–111)
Creatinine, Ser: 0.96 mg/dL (ref 0.61–1.24)
GFR, Estimated: >60 mL/min (ref >60)
Glucose, Bld: 76 mg/dL (ref 70–99)
Potassium: 3.2 mmol/L — ABNORMAL LOW (ref 3.5–5.1)
Sodium: 134 mmol/L — ABNORMAL LOW (ref 135–145)

## 2023-02-13 LAB — CBC
HCT: 32.7 % — ABNORMAL LOW (ref 39.0–52.0)
Hemoglobin: 11.2 g/dL — ABNORMAL LOW (ref 13.0–17.0)
MCH: 30.1 pg (ref 26.0–34.0)
MCHC: 34.3 g/dL (ref 30.0–36.0)
MCV: 87.9 fL (ref 80.0–100.0)
Platelets: 168 10*3/uL (ref 150–400)
RBC: 3.72 MIL/uL — ABNORMAL LOW (ref 4.22–5.81)
RDW: 12.6 % (ref 11.5–15.5)
WBC: 11.6 10*3/uL — ABNORMAL HIGH (ref 4.0–10.5)
nRBC: 0 % (ref 0.0–0.2)

## 2023-02-13 MED ORDER — SIMETHICONE 80 MG PO CHEW
80.0000 mg | CHEWABLE_TABLET | Freq: Four times a day (QID) | ORAL | Status: DC | PRN
  Administered 2023-02-15: 80 mg via ORAL
  Filled 2023-02-13: qty 1

## 2023-02-13 MED ORDER — BISACODYL 10 MG RE SUPP
10.0000 mg | Freq: Once | RECTAL | Status: AC
  Administered 2023-02-13: 10 mg via RECTAL
  Filled 2023-02-13: qty 1
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
## 2023-02-14 LAB — BASIC METABOLIC PANEL
Anion gap: 13 (ref 5–15)
BUN: 5 mg/dL — ABNORMAL LOW (ref 6–20)
CO2: 21 mmol/L — ABNORMAL LOW (ref 22–32)
Calcium: 8.2 mg/dL — ABNORMAL LOW (ref 8.9–10.3)
Chloride: 98 mmol/L (ref 98–111)
Creatinine, Ser: 0.87 mg/dL (ref 0.61–1.24)
GFR, Estimated: >60 mL/min (ref >60)
Glucose, Bld: 76 mg/dL (ref 70–99)
Potassium: 3.1 mmol/L — ABNORMAL LOW (ref 3.5–5.1)
Sodium: 132 mmol/L — ABNORMAL LOW (ref 135–145)

## 2023-02-14 LAB — CBC
HCT: 31.2 % — ABNORMAL LOW (ref 39.0–52.0)
Hemoglobin: 10.8 g/dL — ABNORMAL LOW (ref 13.0–17.0)
MCH: 30.1 pg (ref 26.0–34.0)
MCHC: 34.6 g/dL (ref 30.0–36.0)
MCV: 86.9 fL (ref 80.0–100.0)
Platelets: 227 10*3/uL (ref 150–400)
RBC: 3.59 MIL/uL — ABNORMAL LOW (ref 4.22–5.81)
RDW: 12.5 % (ref 11.5–15.5)
WBC: 8.9 10*3/uL (ref 4.0–10.5)
nRBC: 0 % (ref 0.0–0.2)

## 2023-02-14 NOTE — Plan of Care (Signed)
  Problem: Health Behavior/Discharge Planning: Goal: Ability to manage health-related needs will improve Outcome: Progressing   
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
## 2023-02-15 LAB — CBC
HCT: 28.1 % — ABNORMAL LOW (ref 39.0–52.0)
Hemoglobin: 9.5 g/dL — ABNORMAL LOW (ref 13.0–17.0)
MCH: 29.8 pg (ref 26.0–34.0)
MCHC: 33.8 g/dL (ref 30.0–36.0)
MCV: 88.1 fL (ref 80.0–100.0)
Platelets: 235 10*3/uL (ref 150–400)
RBC: 3.19 MIL/uL — ABNORMAL LOW (ref 4.22–5.81)
RDW: 12.5 % (ref 11.5–15.5)
WBC: 5.4 10*3/uL (ref 4.0–10.5)
nRBC: 0 % (ref 0.0–0.2)

## 2023-02-15 LAB — BASIC METABOLIC PANEL
Anion gap: 11 (ref 5–15)
BUN: 5 mg/dL — ABNORMAL LOW (ref 6–20)
CO2: 21 mmol/L — ABNORMAL LOW (ref 22–32)
Calcium: 8.1 mg/dL — ABNORMAL LOW (ref 8.9–10.3)
Chloride: 99 mmol/L (ref 98–111)
Creatinine, Ser: 0.76 mg/dL (ref 0.61–1.24)
GFR, Estimated: >60 mL/min (ref >60)
Glucose, Bld: 85 mg/dL (ref 70–99)
Potassium: 3.2 mmol/L — ABNORMAL LOW (ref 3.5–5.1)
Sodium: 131 mmol/L — ABNORMAL LOW (ref 135–145)

## 2023-02-15 MED ORDER — IBUPROFEN 600 MG PO TABS
600.0000 mg | ORAL_TABLET | Freq: Three times a day (TID) | ORAL | Status: DC
  Administered 2023-02-15 – 2023-02-16 (×3): 600 mg via ORAL
  Filled 2023-02-15 (×4): qty 1

## 2023-02-15 MED ORDER — POLYETHYLENE GLYCOL 3350 17 G PO PACK
17.0000 g | PACK | Freq: Every day | ORAL | Status: DC
  Filled 2023-02-15: qty 1

## 2023-02-15 MED ORDER — METHOCARBAMOL 500 MG PO TABS
500.0000 mg | ORAL_TABLET | Freq: Three times a day (TID) | ORAL | Status: DC
  Administered 2023-02-15 (×2): 500 mg via ORAL
  Filled 2023-02-15 (×3): qty 1

## 2023-02-15 MED ORDER — POTASSIUM CHLORIDE CRYS ER 20 MEQ PO TBCR
40.0000 meq | EXTENDED_RELEASE_TABLET | Freq: Two times a day (BID) | ORAL | Status: AC
  Administered 2023-02-15 (×2): 40 meq via ORAL
  Filled 2023-02-15 (×2): qty 2

## 2023-02-15 NOTE — Discharge Instructions (Signed)
Wound Care for lower wound: gently pack saline moistened gauze into inferior wound and cover with dry dressing twice daily or more as needed for saturation. You may shower with wound open  [facility]      [facility], [place] [phone]  OPEN ABDOMINAL SURGERY: POST OP INSTRUCTIONS  Always review your discharge instruction sheet given to you by the facility where your surgery was performed.  IF YOU HAVE DISABILITY OR FAMILY LEAVE FORMS, YOU MUST BRING THEM TO THE OFFICE FOR PROCESSING.  PLEASE DO NOT GIVE THEM TO YOUR DOCTOR.  A prescription for pain medication may be given to you upon discharge.  Take your pain medication as prescribed, if needed.  If narcotic pain medicine is not needed, then you may take acetaminophen (Tylenol) or ibuprofen (Advil) as needed. Take your usually prescribed medications unless otherwise directed. If you need a refill on your pain medication, please contact your pharmacy. They will contact our office to request authorization.  Prescriptions will not be filled after 5pm or on week-ends. You should follow a light diet the first few days after arrival home, such as soup and crackers, pudding, etc.unless your doctor has advised otherwise. A high-fiber, low fat diet can be resumed as tolerated.   Be sure to include lots of fluids daily. Most patients will experience some swelling and bruising on the chest and neck area.  Ice packs will help.  Swelling and bruising can take several days to resolve Most patients will experience some swelling and bruising in the area of the incision. Ice pack will help. Swelling and bruising can take several days to resolve..  It is common to experience some constipation if taking pain medication after surgery.  Increasing fluid intake and taking a stool softener will usually help or prevent this problem from occurring.  A mild laxative (Milk of Magnesia or Miralax) should be taken according to package directions if there are no bowel  movements after 48 hours.  You may have steri-strips (small skin tapes) in place directly over the incision.  These strips should be left on the skin for 7-10 days.  If your surgeon used skin glue on the incision, you may shower in 24 hours.  The glue will flake off over the next 2-3 weeks.  Any sutures or staples will be removed at the office during your follow-up visit. You may find that a light gauze bandage over your incision may keep your staples from being rubbed or pulled. You may shower and replace the bandage daily. ACTIVITIES:  You may resume regular (light) daily activities beginning the next day--such as daily self-care, walking, climbing stairs--gradually increasing activities as tolerated.  You may have sexual intercourse when it is comfortable.  Refrain from any heavy lifting or straining until approved by your doctor. You may drive when you no longer are taking prescription pain medication, you can comfortably wear a seatbelt, and you can safely maneuver your car and apply brakes Return to Work: ___________________________________ Kerry Harvey should see your doctor in the office for a follow-up appointment approximately two weeks after your surgery.  Make sure that you call for this appointment within a day or two after you arrive home to insure a convenient appointment time. OTHER INSTRUCTIONS:  _____________________________________________________________ _____________________________________________________________  WHEN TO CALL YOUR DOCTOR: Fever over 101.0 Inability to urinate Nausea and/or vomiting Extreme swelling or bruising Continued bleeding from incision. Increased pain, redness, or drainage from the incision. Difficulty swallowing or breathing Muscle cramping or spasms. Numbness or tingling in  hands or feet or around lips.  The clinic staff is available to answer your questions during regular business hours.  Please don't hesitate to call and ask to speak to one of the nurses  if you have concerns.  For further questions, please visit [email or web address]       Managing Your Pain After Surgery Without Opioids    Thank you for participating in our program to help patients manage their pain after surgery without opioids. This is part of our effort to provide you with the best care possible, without exposing you or your family to the risk that opioids pose.  What pain can I expect after surgery? You can expect to have some pain after surgery. This is normal. The pain is typically worse the day after surgery, and quickly begins to get better. Many studies have found that many patients are able to manage their pain after surgery with Over-the-Counter (OTC) medications such as Tylenol and Motrin. If you have a condition that does not allow you to take Tylenol or Motrin, notify your surgical team.  How will I manage my pain? The best strategy for controlling your pain after surgery is around the clock pain control with Tylenol (acetaminophen) and Motrin (ibuprofen or Advil). Alternating these medications with each other allows you to maximize your pain control. In addition to Tylenol and Motrin, you can use heating pads or ice packs on your incisions to help reduce your pain.  How will I alternate your regular strength over-the-counter pain medication? You will take a dose of pain medication every three hours. Start by taking 650 mg of Tylenol (2 pills of 325 mg) 3 hours later take 600 mg of Motrin (3 pills of 200 mg) 3 hours after taking the Motrin take 650 mg of Tylenol 3 hours after that take 600 mg of Motrin.   - 1 -  See example - if your first dose of Tylenol is at 12:00 PM   12:00 PM Tylenol 650 mg (2 pills of 325 mg)  3:00 PM Motrin 600 mg (3 pills of 200 mg)  6:00 PM Tylenol 650 mg (2 pills of 325 mg)  9:00 PM Motrin 600 mg (3 pills of 200 mg)  Continue alternating every 3 hours   We recommend that you follow this schedule  around-the-clock for at least 3 days after surgery, or until you feel that it is no longer needed. Use the table on the last page of this handout to keep track of the medications you are taking. Important: Do not take more than 3000mg  of Tylenol or 3200mg  of Motrin in a 24-hour period. Do not take ibuprofen/Motrin if you have a history of bleeding stomach ulcers, severe kidney disease, &/or actively taking a blood thinner  What if I still have pain? If you have pain that is not controlled with the over-the-counter pain medications (Tylenol and Motrin or Advil) you might have what we call "breakthrough" pain. You will receive a prescription for a small amount of an opioid pain medication such as Oxycodone, Tramadol, or Tylenol with Codeine. Use these opioid pills in the first 24 hours after surgery if you have breakthrough pain. Do not take more than 1 pill every 4-6 hours.  If you still have uncontrolled pain after using all opioid pills, don't hesitate to call our staff using the number provided. We will help make sure you are managing your pain in the best way possible, and if necessary, we can provide a prescription for additional  pain medication.   Day 1    Time  Name of Medication Number of pills taken  Amount of Acetaminophen  Pain Level   Comments  AM PM       AM PM       AM PM       AM PM       AM PM       AM PM       AM PM       AM PM       Total Daily amount of Acetaminophen Do not take more than  3,000 mg per day      Day 2    Time  Name of Medication Number of pills taken  Amount of Acetaminophen  Pain Level   Comments  AM PM       AM PM       AM PM       AM PM       AM PM       AM PM       AM PM       AM PM       Total Daily amount of Acetaminophen Do not take more than  3,000 mg per day      Day 3    Time  Name of Medication Number of pills taken  Amount of Acetaminophen  Pain Level   Comments  AM PM       AM PM       AM PM       AM PM          AM PM       AM PM       AM PM       AM PM       Total Daily amount of Acetaminophen Do not take more than  3,000 mg per day      Day 4    Time  Name of Medication Number of pills taken  Amount of Acetaminophen  Pain Level   Comments  AM PM       AM PM       AM PM       AM PM       AM PM       AM PM       AM PM       AM PM       Total Daily amount of Acetaminophen Do not take more than  3,000 mg per day      Day 5    Time  Name of Medication Number of pills taken  Amount of Acetaminophen  Pain Level   Comments  AM PM       AM PM       AM PM       AM PM       AM PM       AM PM       AM PM       AM PM       Total Daily amount of Acetaminophen Do not take more than  3,000 mg per day      Day 6    Time  Name of Medication Number of pills taken  Amount of Acetaminophen  Pain Level  Comments  AM PM       AM PM       AM PM  AM PM       AM PM       AM PM       AM PM       AM PM       Total Daily amount of Acetaminophen Do not take more than  3,000 mg per day      Day 7    Time  Name of Medication Number of pills taken  Amount of Acetaminophen  Pain Level   Comments  AM PM       AM PM       AM PM       AM PM       AM PM       AM PM       AM PM       AM PM       Total Daily amount of Acetaminophen Do not take more than  3,000 mg per day        For additional information about how and where to safely dispose of unused opioid medications - [email or web address]  Disclaimer: This document contains information and/or instructional materials adapted from [facility] for the typical patient with your condition. It does not replace medical advice from your health care provider because your experience may differ from that of the typical patient. Talk to your health care provider if you have any questions about this document, your condition or your treatment plan. Adapted from [facility]
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
## 2023-02-15 NOTE — Plan of Care (Signed)
  Problem: Clinical Measurements: Goal: Ability to maintain clinical measurements within normal limits will improve Outcome: Progressing   
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
## 2023-02-16 ENCOUNTER — Encounter (HOSPITAL_BASED_OUTPATIENT_CLINIC_OR_DEPARTMENT_OTHER): Payer: Self-pay | Admitting: Obstetrics and Gynecology

## 2023-02-16 LAB — BASIC METABOLIC PANEL
Anion gap: 13 (ref 5–15)
BUN: 7 mg/dL (ref 6–20)
CO2: 23 mmol/L (ref 22–32)
Calcium: 8.8 mg/dL — ABNORMAL LOW (ref 8.9–10.3)
Chloride: 98 mmol/L (ref 98–111)
Creatinine, Ser: 0.76 mg/dL (ref 0.61–1.24)
GFR, Estimated: >60 mL/min (ref >60)
Glucose, Bld: 90 mg/dL (ref 70–99)
Potassium: 4 mmol/L (ref 3.5–5.1)
Sodium: 134 mmol/L — ABNORMAL LOW (ref 135–145)

## 2023-02-16 LAB — CBC
HCT: 28.3 % — ABNORMAL LOW (ref 39.0–52.0)
Hemoglobin: 9.9 g/dL — ABNORMAL LOW (ref 13.0–17.0)
MCH: 30.4 pg (ref 26.0–34.0)
MCHC: 35 g/dL (ref 30.0–36.0)
MCV: 86.8 fL (ref 80.0–100.0)
Platelets: 284 10*3/uL (ref 150–400)
RBC: 3.26 MIL/uL — ABNORMAL LOW (ref 4.22–5.81)
RDW: 12.4 % (ref 11.5–15.5)
WBC: 7.5 10*3/uL (ref 4.0–10.5)
nRBC: 0 % (ref 0.0–0.2)

## 2023-02-16 MED ORDER — ACETAMINOPHEN 500 MG PO TABS: 1000.0000 mg | ORAL_TABLET | Freq: Four times a day (QID) | ORAL | 0 refills | Status: DC

## 2023-02-16 MED ORDER — POLYETHYLENE GLYCOL 3350 17 G PO PACK: 17.0000 g | PACK | Freq: Every day | ORAL | 0 refills | Status: DC | PRN

## 2023-02-16 MED ORDER — METHOCARBAMOL 500 MG PO TABS: 500.0000 mg | ORAL_TABLET | Freq: Three times a day (TID) | ORAL | 0 refills | Status: DC

## 2023-02-16 MED ORDER — OXYCODONE HCL 10 MG PO TABS: 10.0000 mg | ORAL_TABLET | Freq: Four times a day (QID) | ORAL | 0 refills | Status: DC | PRN

## 2023-02-16 MED ORDER — IBUPROFEN 600 MG PO TABS: 600.0000 mg | ORAL_TABLET | Freq: Three times a day (TID) | ORAL | 0 refills | Status: AC

## 2023-02-16 MED ORDER — DOCUSATE SODIUM 100 MG PO CAPS: 100.0000 mg | ORAL_CAPSULE | Freq: Two times a day (BID) | ORAL | 0 refills | Status: AC

## 2023-02-16 NOTE — Progress Notes (Signed)
Patient's abd dressing changed and IV is out. No tele. RA. No other needs at this time
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
## 2023-02-16 NOTE — Plan of Care (Signed)

## 2023-02-18 ENCOUNTER — Other Ambulatory Visit: Payer: Self-pay

## 2023-02-18 ENCOUNTER — Inpatient Hospital Stay (HOSPITAL_COMMUNITY): Payer: BLUE CROSS/BLUE SHIELD

## 2023-02-18 ENCOUNTER — Inpatient Hospital Stay (HOSPITAL_BASED_OUTPATIENT_CLINIC_OR_DEPARTMENT_OTHER)
Admission: EM | Admit: 2023-02-18 | Discharge: 2023-02-21 | DRG: 862 | Disposition: A | Discharge status: Home / Self Care | Payer: BLUE CROSS/BLUE SHIELD | Attending: General Surgery | Admitting: General Surgery

## 2023-02-18 ENCOUNTER — Emergency Department (HOSPITAL_BASED_OUTPATIENT_CLINIC_OR_DEPARTMENT_OTHER): Payer: BLUE CROSS/BLUE SHIELD

## 2023-02-18 ENCOUNTER — Encounter (HOSPITAL_BASED_OUTPATIENT_CLINIC_OR_DEPARTMENT_OTHER): Payer: Self-pay

## 2023-02-18 DIAGNOSIS — Z79899 Other long term (current) drug therapy: Secondary | ICD-10-CM | POA: Diagnosis not present

## 2023-02-18 DIAGNOSIS — E876 Hypokalemia: Secondary | ICD-10-CM | POA: Diagnosis present

## 2023-02-18 DIAGNOSIS — B9689 Other specified bacterial agents as the cause of diseases classified elsewhere: Secondary | ICD-10-CM | POA: Diagnosis present

## 2023-02-18 DIAGNOSIS — K651 Peritoneal abscess: Secondary | ICD-10-CM | POA: Diagnosis present

## 2023-02-18 DIAGNOSIS — K567 Ileus, unspecified: Secondary | ICD-10-CM | POA: Diagnosis present

## 2023-02-18 DIAGNOSIS — R109 Unspecified abdominal pain: Secondary | ICD-10-CM | POA: Diagnosis present

## 2023-02-18 DIAGNOSIS — T8143XA Infection following a procedure, organ and space surgical site, initial encounter: Secondary | ICD-10-CM | POA: Diagnosis present

## 2023-02-18 DIAGNOSIS — Z9049 Acquired absence of other specified parts of digestive tract: Secondary | ICD-10-CM | POA: Diagnosis not present

## 2023-02-18 DIAGNOSIS — T8130XA Disruption of wound, unspecified, initial encounter: Secondary | ICD-10-CM | POA: Diagnosis present

## 2023-02-18 DIAGNOSIS — T8140XA Infection following a procedure, unspecified, initial encounter: Principal | ICD-10-CM

## 2023-02-18 LAB — CBC WITH DIFFERENTIAL/PLATELET
Abs Immature Granulocytes: 0.57 10*3/uL — ABNORMAL HIGH (ref 0.00–0.07)
Basophils Absolute: 0 10*3/uL (ref 0.0–0.1)
Basophils Relative: 0 %
Eosinophils Absolute: 0.2 10*3/uL (ref 0.0–0.5)
Eosinophils Relative: 2 %
HCT: 28.8 % — ABNORMAL LOW (ref 39.0–52.0)
Hemoglobin: 10.3 g/dL — ABNORMAL LOW (ref 13.0–17.0)
Immature Granulocytes: 6 %
Lymphocytes Relative: 18 %
Lymphs Abs: 1.8 10*3/uL (ref 0.7–4.0)
MCH: 30.4 pg (ref 26.0–34.0)
MCHC: 35.8 g/dL (ref 30.0–36.0)
MCV: 85 fL (ref 80.0–100.0)
Monocytes Absolute: 1.2 10*3/uL — ABNORMAL HIGH (ref 0.1–1.0)
Monocytes Relative: 12 %
Neutro Abs: 6.3 10*3/uL (ref 1.7–7.7)
Neutrophils Relative %: 62 %
Platelets: 462 10*3/uL — ABNORMAL HIGH (ref 150–400)
RBC: 3.39 MIL/uL — ABNORMAL LOW (ref 4.22–5.81)
RDW: 12.9 % (ref 11.5–15.5)
WBC: 10 10*3/uL (ref 4.0–10.5)
nRBC: 0 % (ref 0.0–0.2)

## 2023-02-18 LAB — AEROBIC/ANAEROBIC CULTURE W GRAM STAIN (SURGICAL/DEEP WOUND)

## 2023-02-18 LAB — BASIC METABOLIC PANEL
Anion gap: 14 (ref 5–15)
BUN: 12 mg/dL (ref 6–20)
CO2: 24 mmol/L (ref 22–32)
Calcium: 9.2 mg/dL (ref 8.9–10.3)
Chloride: 96 mmol/L — ABNORMAL LOW (ref 98–111)
Creatinine, Ser: 0.67 mg/dL (ref 0.61–1.24)
GFR, Estimated: >60 mL/min (ref >60)
Glucose, Bld: 102 mg/dL — ABNORMAL HIGH (ref 70–99)
Potassium: 3.1 mmol/L — ABNORMAL LOW (ref 3.5–5.1)
Sodium: 134 mmol/L — ABNORMAL LOW (ref 135–145)

## 2023-02-18 MED ORDER — ACETAMINOPHEN 500 MG PO TABS: 1000.0000 mg | ORAL_TABLET | Freq: Four times a day (QID) | ORAL | Status: DC

## 2023-02-18 MED ORDER — FENTANYL CITRATE (PF) 100 MCG/2ML IJ SOLN
INTRAMUSCULAR | Status: AC | PRN
  Administered 2023-02-18 (×2): 50 ug via INTRAVENOUS

## 2023-02-18 MED ORDER — LIDOCAINE-EPINEPHRINE 1 %-1:100000 IJ SOLN
20.0000 mL | Freq: Once | INTRAMUSCULAR | Status: AC
  Administered 2023-02-18: 20 mL via INTRADERMAL

## 2023-02-18 MED ORDER — HYDRALAZINE HCL 20 MG/ML IJ SOLN: 10.0000 mg | INTRAMUSCULAR | Status: DC | PRN

## 2023-02-18 MED ORDER — VANCOMYCIN HCL IN DEXTROSE 1-5 GM/200ML-% IV SOLN
1000.0000 mg | Freq: Once | INTRAVENOUS | Status: AC
  Administered 2023-02-18: 1000 mg via INTRAVENOUS
  Filled 2023-02-18: qty 200

## 2023-02-18 MED ORDER — ACETAMINOPHEN 500 MG PO TABS
1000.0000 mg | ORAL_TABLET | Freq: Four times a day (QID) | ORAL | Status: DC
  Administered 2023-02-18 – 2023-02-21 (×11): 1000 mg via ORAL
  Filled 2023-02-18 (×11): qty 2

## 2023-02-18 MED ORDER — METHOCARBAMOL 500 MG PO TABS: 500.0000 mg | ORAL_TABLET | Freq: Three times a day (TID) | ORAL | Status: DC

## 2023-02-18 MED ORDER — POLYETHYLENE GLYCOL 3350 17 G PO PACK: 17.0000 g | PACK | Freq: Every day | ORAL | Status: DC | PRN

## 2023-02-18 MED ORDER — ONDANSETRON HCL 4 MG/2ML IJ SOLN: 4.0000 mg | Freq: Four times a day (QID) | INTRAMUSCULAR | Status: DC | PRN

## 2023-02-18 MED ORDER — FENTANYL CITRATE (PF) 100 MCG/2ML IJ SOLN
INTRAMUSCULAR | Status: AC
  Filled 2023-02-18: qty 2

## 2023-02-18 MED ORDER — ENOXAPARIN SODIUM 30 MG/0.3ML IJ SOSY
30.0000 mg | PREFILLED_SYRINGE | Freq: Two times a day (BID) | INTRAMUSCULAR | Status: DC
  Administered 2023-02-19 – 2023-02-21 (×5): 30 mg via SUBCUTANEOUS
  Filled 2023-02-18 (×5): qty 0.3

## 2023-02-18 MED ORDER — OXYCODONE HCL 5 MG PO TABS
10.0000 mg | ORAL_TABLET | Freq: Four times a day (QID) | ORAL | Status: DC | PRN
  Administered 2023-02-20 – 2023-02-21 (×2): 10 mg via ORAL
  Filled 2023-02-18 (×2): qty 2

## 2023-02-18 MED ORDER — MIDAZOLAM HCL 2 MG/2ML IJ SOLN
INTRAMUSCULAR | Status: AC | PRN
  Administered 2023-02-18 (×2): 1 mg via INTRAVENOUS

## 2023-02-18 MED ORDER — DIPHENHYDRAMINE HCL 50 MG/ML IJ SOLN
INTRAMUSCULAR | Status: AC | PRN
  Administered 2023-02-18: 50 mg via INTRAVENOUS

## 2023-02-18 MED ORDER — PERMETHRIN 1 % EX LOTN: TOPICAL_LOTION | Freq: Once | CUTANEOUS | Status: DC

## 2023-02-18 MED ORDER — PIPERACILLIN-TAZOBACTAM 3.375 G IVPB
3.3750 g | Freq: Three times a day (TID) | INTRAVENOUS | Status: DC
  Administered 2023-02-18 – 2023-02-21 (×9): 3.375 g via INTRAVENOUS
  Filled 2023-02-18 (×8): qty 50

## 2023-02-18 MED ORDER — MIDAZOLAM HCL 2 MG/2ML IJ SOLN
INTRAMUSCULAR | Status: AC
  Filled 2023-02-18: qty 2

## 2023-02-18 MED ORDER — HYDROMORPHONE HCL 1 MG/ML IJ SOLN
0.5000 mg | INTRAMUSCULAR | Status: DC | PRN
  Administered 2023-02-18 (×2): 0.5 mg via INTRAVENOUS
  Filled 2023-02-18 (×2): qty 0.5

## 2023-02-18 MED ORDER — MELATONIN 3 MG PO TABS: 3.0000 mg | ORAL_TABLET | Freq: Every evening | ORAL | Status: DC | PRN

## 2023-02-18 MED ORDER — DOCUSATE SODIUM 100 MG PO CAPS: 100.0000 mg | ORAL_CAPSULE | Freq: Two times a day (BID) | ORAL | Status: DC

## 2023-02-18 MED ORDER — DIPHENHYDRAMINE HCL 50 MG/ML IJ SOLN
INTRAMUSCULAR | Status: AC
  Filled 2023-02-18: qty 1

## 2023-02-18 MED ORDER — SODIUM CHLORIDE 0.9% FLUSH
5.0000 mL | Freq: Three times a day (TID) | INTRAVENOUS | Status: DC
  Administered 2023-02-18 – 2023-02-21 (×9): 5 mL

## 2023-02-18 MED ORDER — METHOCARBAMOL 500 MG PO TABS
500.0000 mg | ORAL_TABLET | Freq: Three times a day (TID) | ORAL | Status: AC
  Administered 2023-02-18 – 2023-02-20 (×8): 500 mg via ORAL
  Filled 2023-02-18 (×10): qty 1

## 2023-02-18 MED ORDER — IBUPROFEN 600 MG PO TABS
600.0000 mg | ORAL_TABLET | Freq: Three times a day (TID) | ORAL | Status: DC
  Administered 2023-02-18 – 2023-02-21 (×8): 600 mg via ORAL
  Filled 2023-02-18 (×8): qty 1

## 2023-02-18 MED ORDER — LACTATED RINGERS IV SOLN: INTRAVENOUS | Status: DC

## 2023-02-18 MED ORDER — METOPROLOL TARTRATE 5 MG/5ML IV SOLN: 5.0000 mg | Freq: Four times a day (QID) | INTRAVENOUS | Status: DC | PRN

## 2023-02-18 MED ORDER — METHOCARBAMOL 1000 MG/10ML IJ SOLN
500.0000 mg | Freq: Three times a day (TID) | INTRAVENOUS | Status: AC
  Filled 2023-02-18: qty 5

## 2023-02-18 MED ORDER — IOHEXOL 300 MG/ML  SOLN
100.0000 mL | Freq: Once | INTRAMUSCULAR | Status: AC | PRN
  Administered 2023-02-18: 80 mL via INTRAVENOUS

## 2023-02-18 MED ORDER — PIPERACILLIN-TAZOBACTAM 3.375 G IVPB 30 MIN
3.3750 g | Freq: Once | INTRAVENOUS | Status: AC
  Administered 2023-02-18: 3.375 g via INTRAVENOUS
  Filled 2023-02-18: qty 50

## 2023-02-18 MED ORDER — ONDANSETRON 4 MG PO TBDP: 4.0000 mg | ORAL_TABLET | Freq: Four times a day (QID) | ORAL | Status: DC | PRN

## 2023-02-18 MED ORDER — DOCUSATE SODIUM 100 MG PO CAPS
100.0000 mg | ORAL_CAPSULE | Freq: Two times a day (BID) | ORAL | Status: DC
  Administered 2023-02-18 – 2023-02-21 (×6): 100 mg via ORAL
  Filled 2023-02-18 (×5): qty 1

## 2023-02-18 NOTE — Discharge Instructions (Addendum)
WOUND CARE: - midline dressing to be changed daily - supplies: sterile saline, gauze, scissors, tape  - remove dressing and all packing carefully, moistening with sterile saline as needed to avoid packing/internal dressing sticking to the wound. - clean edges of skin around the wound with water/gauze, making sure there is no tape debris or leakage left on skin that could cause skin irritation or breakdown. - dampen and clean gauze with sterile saline and pack wound from wound base to skin level, making sure to take note of any possible areas of wound tracking, tunneling and packing appropriately. Wound can be packed loosely. Trim gauze to size if a whole gauze is not required. - cover wound with a dry gauze and secure with tape.  - write the date/time on the dry dressing/tape to better track when the last dressing change occurred. - change dressing as needed if leakage occurs, wound gets contaminated, or patient requests to shower. - patient may shower daily with wound open (i.e. remove all packing) and following the shower the wound should be dried and a clean dressing placed.    IR Drain Care Instruction     Always wash your hands before manipulating drain Flush the drain with 5 cc normal saline daily - Please pick up saline flushes at [facility] outpatient pharmacy  Record output daily (subtract 5 cc that was used to flush the drain from the total daily output)  Dressing change as needed and at least once a week You will receive a call from [facility] schedulers. If you have any questions about your follow up appointment, please call [phone] If you have concerns about your drain, please call [phone]
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
## 2023-02-18 NOTE — ED Notes (Signed)
Attempted to contact [facility], no answer.
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
## 2023-02-18 NOTE — Procedures (Signed)
Pre procedural Dx: Pelvic fluid collection Post procedural Dx: Same  Technically successful CT guided placed of a 10 Fr drainage catheter placement into the pelvis via left transgluteal approach yielding 70 cc of bloody serous fluid.   A representative aspirated sample was capped and sent to the laboratory for analysis.    EBL: Trace Complications: None immediate  Katherina Right, MD Pager #: [phone]

## 2023-02-18 NOTE — ED Notes (Signed)
Attempted to contact [facility] again, no answer.

## 2023-02-18 NOTE — ED Notes (Signed)
Report called to [facility] ED. 
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
## 2023-02-18 NOTE — Progress Notes (Signed)
Pharmacy Antibiotic Note  Kerry Harvey is a 32 y.o. male admitted on 02/18/2023 with  intra-abdominal infection .  Pharmacy has been consulted for Zosyn dosing.  Plan: Zosyn 3.375g IV q8h (4 hour infusion). Follow culture data for de-escalation.  Monitor renal function for dose adjustments as indicated.   Height: 5\' 9"  (175.3 cm) Weight: 72.6 kg (160 lb) IBW/kg (Calculated) : 70.7  Temp (24hrs), Avg:98.5 F (36.9 C), Min:98.3 F (36.8 C), Max:98.6 F (37 C)  Recent Labs  Lab 02/13/23 0045 02/14/23 0427 02/15/23 0158 02/16/23 0712 02/18/23 0132  WBC 11.6* 8.9 5.4 7.5 10.0  CREATININE 0.96 0.87 0.76 0.76 0.67    Estimated Creatinine Clearance: 135 mL/min (by C-G formula based on SCr of 0.67 mg/dL).    No Known Allergies  Thank you for allowing pharmacy to be a part of this patient's care.  Estill Batten, PharmD, BCCCP  02/18/2023 5:57 AM
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
## 2023-02-18 NOTE — ED Provider Notes (Signed)
  5:24 AM Dr. Cliffton Asters notified of patient's arrival in the ED.  He will evaluate, plan to admit for ongoing care.   Garlon Hatchet, PA-C 02/18/23 0524    Sabas Sous, MD 02/18/23 [phone]
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
## 2023-02-19 ENCOUNTER — Other Ambulatory Visit (HOSPITAL_COMMUNITY): Payer: Self-pay

## 2023-02-19 ENCOUNTER — Other Ambulatory Visit: Payer: Self-pay | Admitting: Student

## 2023-02-19 LAB — CBC
HCT: 25.6 % — ABNORMAL LOW (ref 39.0–52.0)
Hemoglobin: 9.1 g/dL — ABNORMAL LOW (ref 13.0–17.0)
MCH: 30.4 pg (ref 26.0–34.0)
MCHC: 35.5 g/dL (ref 30.0–36.0)
MCV: 85.6 fL (ref 80.0–100.0)
Platelets: 472 10*3/uL — ABNORMAL HIGH (ref 150–400)
RBC: 2.99 MIL/uL — ABNORMAL LOW (ref 4.22–5.81)
RDW: 13 % (ref 11.5–15.5)
WBC: 9 10*3/uL (ref 4.0–10.5)
nRBC: 0 % (ref 0.0–0.2)

## 2023-02-19 LAB — BASIC METABOLIC PANEL
Anion gap: 17 — ABNORMAL HIGH (ref 5–15)
BUN: 9 mg/dL (ref 6–20)
CO2: 24 mmol/L (ref 22–32)
Calcium: 8.4 mg/dL — ABNORMAL LOW (ref 8.9–10.3)
Chloride: 95 mmol/L — ABNORMAL LOW (ref 98–111)
Creatinine, Ser: 0.73 mg/dL (ref 0.61–1.24)
GFR, Estimated: >60 mL/min (ref >60)
Glucose, Bld: 89 mg/dL (ref 70–99)
Potassium: 2.9 mmol/L — ABNORMAL LOW (ref 3.5–5.1)
Sodium: 136 mmol/L (ref 135–145)

## 2023-02-19 LAB — AEROBIC/ANAEROBIC CULTURE W GRAM STAIN (SURGICAL/DEEP WOUND)

## 2023-02-19 LAB — CULTURE, BLOOD (ROUTINE X 2)
Culture: NO GROWTH
Culture: NO GROWTH

## 2023-02-19 MED ORDER — SODIUM CHLORIDE FLUSH 0.9 % IV SOLN
5.0000 mL | Freq: Every day | INTRAVENOUS | 0 refills | Status: DC
  Filled 2023-02-19: qty 200, 20d supply, fill #0

## 2023-02-19 MED ORDER — POTASSIUM CHLORIDE CRYS ER 20 MEQ PO TBCR
40.0000 meq | EXTENDED_RELEASE_TABLET | Freq: Three times a day (TID) | ORAL | Status: AC
  Administered 2023-02-19 (×2): 40 meq via ORAL
  Filled 2023-02-19 (×3): qty 2

## 2023-02-19 MED ORDER — POTASSIUM CHLORIDE 20 MEQ PO PACK
40.0000 meq | PACK | Freq: Three times a day (TID) | ORAL | Status: AC
  Administered 2023-02-19: 40 meq via ORAL
  Filled 2023-02-19 (×2): qty 2

## 2023-02-19 MED ORDER — MAGNESIUM SULFATE 2 GM/50ML IV SOLN
2.0000 g | Freq: Once | INTRAVENOUS | Status: AC
  Administered 2023-02-19: 2 g via INTRAVENOUS
  Filled 2023-02-19: qty 50
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
## 2023-02-20 LAB — CULTURE, BLOOD (ROUTINE X 2)
Special Requests: NORMAL
Special Requests: NORMAL

## 2023-02-20 MED ORDER — POTASSIUM CHLORIDE 20 MEQ PO PACK
40.0000 meq | PACK | Freq: Three times a day (TID) | ORAL | Status: AC
  Filled 2023-02-20: qty 2

## 2023-02-20 MED ORDER — POLYETHYLENE GLYCOL 3350 17 G PO PACK
17.0000 g | PACK | Freq: Every day | ORAL | Status: DC
  Administered 2023-02-20: 17 g via ORAL
  Filled 2023-02-20 (×2): qty 1

## 2023-02-20 MED ORDER — POTASSIUM CHLORIDE CRYS ER 20 MEQ PO TBCR
40.0000 meq | EXTENDED_RELEASE_TABLET | Freq: Three times a day (TID) | ORAL | Status: AC
  Administered 2023-02-20 (×3): 40 meq via ORAL
  Filled 2023-02-20 (×3): qty 2
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
## 2023-02-21 LAB — BASIC METABOLIC PANEL
Anion gap: 8 (ref 5–15)
BUN: 5 mg/dL — ABNORMAL LOW (ref 6–20)
CO2: 24 mmol/L (ref 22–32)
Calcium: 8.5 mg/dL — ABNORMAL LOW (ref 8.9–10.3)
Chloride: 101 mmol/L (ref 98–111)
Creatinine, Ser: 0.67 mg/dL (ref 0.61–1.24)
GFR, Estimated: >60 mL/min (ref >60)
Glucose, Bld: 115 mg/dL — ABNORMAL HIGH (ref 70–99)
Potassium: 3.7 mmol/L (ref 3.5–5.1)
Sodium: 133 mmol/L — ABNORMAL LOW (ref 135–145)

## 2023-02-21 LAB — CULTURE, BLOOD (ROUTINE X 2)

## 2023-02-21 MED ORDER — OXYCODONE HCL 10 MG PO TABS: 10.0000 mg | ORAL_TABLET | Freq: Four times a day (QID) | ORAL | 0 refills | Status: AC | PRN

## 2023-02-21 MED ORDER — ACETAMINOPHEN 500 MG PO TABS: 1000.0000 mg | ORAL_TABLET | Freq: Four times a day (QID) | ORAL | 0 refills | Status: AC | PRN

## 2023-02-21 MED ORDER — AMOXICILLIN-POT CLAVULANATE 875-125 MG PO TABS: 1.0000 | ORAL_TABLET | Freq: Two times a day (BID) | ORAL | 0 refills | Status: AC

## 2023-02-21 MED ORDER — METHOCARBAMOL 500 MG PO TABS: 500.0000 mg | ORAL_TABLET | Freq: Three times a day (TID) | ORAL | 0 refills | Status: AC | PRN
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
## 2023-02-22 ENCOUNTER — Other Ambulatory Visit (HOSPITAL_COMMUNITY): Payer: Self-pay

## 2023-02-22 LAB — CULTURE, BLOOD (ROUTINE X 2)
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
## 2023-02-23 ENCOUNTER — Encounter (HOSPITAL_COMMUNITY): Payer: Self-pay

## 2023-02-23 ENCOUNTER — Emergency Department (HOSPITAL_COMMUNITY)
Admission: EM | Admit: 2023-02-23 | Discharge: 2023-02-23 | Disposition: A | Discharge status: Home / Self Care | Payer: BLUE CROSS/BLUE SHIELD | Attending: Emergency Medicine | Admitting: Emergency Medicine

## 2023-02-23 ENCOUNTER — Other Ambulatory Visit (HOSPITAL_COMMUNITY): Payer: Self-pay | Admitting: Surgery

## 2023-02-23 ENCOUNTER — Other Ambulatory Visit: Payer: Self-pay

## 2023-02-23 DIAGNOSIS — R410 Disorientation, unspecified: Secondary | ICD-10-CM | POA: Insufficient documentation

## 2023-02-23 DIAGNOSIS — R799 Abnormal finding of blood chemistry, unspecified: Secondary | ICD-10-CM | POA: Insufficient documentation

## 2023-02-23 DIAGNOSIS — R42 Dizziness and giddiness: Secondary | ICD-10-CM | POA: Insufficient documentation

## 2023-02-23 DIAGNOSIS — T8140XA Infection following a procedure, unspecified, initial encounter: Secondary | ICD-10-CM

## 2023-02-23 DIAGNOSIS — R7881 Bacteremia: Secondary | ICD-10-CM | POA: Diagnosis not present

## 2023-02-23 LAB — COMPREHENSIVE METABOLIC PANEL
ALT: 25 U/L (ref 0–44)
AST: 28 U/L (ref 15–41)
Albumin: 3.1 g/dL — ABNORMAL LOW (ref 3.5–5.0)
Alkaline Phosphatase: 70 U/L (ref 38–126)
Anion gap: 10 (ref 5–15)
BUN: 7 mg/dL (ref 6–20)
CO2: 27 mmol/L (ref 22–32)
Calcium: 8.8 mg/dL — ABNORMAL LOW (ref 8.9–10.3)
Chloride: 100 mmol/L (ref 98–111)
Creatinine, Ser: 0.77 mg/dL (ref 0.61–1.24)
GFR, Estimated: >60 mL/min (ref >60)
Glucose, Bld: 127 mg/dL — ABNORMAL HIGH (ref 70–99)
Potassium: 4.1 mmol/L (ref 3.5–5.1)
Sodium: 137 mmol/L (ref 135–145)
Total Bilirubin: 0.7 mg/dL (ref 0.3–1.2)
Total Protein: 7 g/dL (ref 6.5–8.1)

## 2023-02-23 LAB — CBC
HCT: 31.8 % — ABNORMAL LOW (ref 39.0–52.0)
Hemoglobin: 10.6 g/dL — ABNORMAL LOW (ref 13.0–17.0)
MCH: 30 pg (ref 26.0–34.0)
MCHC: 33.3 g/dL (ref 30.0–36.0)
MCV: 90.1 fL (ref 80.0–100.0)
Platelets: 738 10*3/uL — ABNORMAL HIGH (ref 150–400)
RBC: 3.53 MIL/uL — ABNORMAL LOW (ref 4.22–5.81)
RDW: 13.7 % (ref 11.5–15.5)
WBC: 10.7 10*3/uL — ABNORMAL HIGH (ref 4.0–10.5)
nRBC: 0 % (ref 0.0–0.2)

## 2023-02-23 LAB — AEROBIC/ANAEROBIC CULTURE W GRAM STAIN (SURGICAL/DEEP WOUND)

## 2023-02-23 LAB — LACTIC ACID, PLASMA: Lactic Acid, Venous: 1.9 mmol/L (ref 0.5–1.9)

## 2023-02-23 MED ORDER — VANCOMYCIN HCL 1500 MG/300ML IV SOLN
1500.0000 mg | Freq: Once | INTRAVENOUS | Status: AC
  Administered 2023-02-23: 1500 mg via INTRAVENOUS
  Filled 2023-02-23: qty 300

## 2023-02-23 MED ORDER — SODIUM CHLORIDE 0.9 % IV BOLUS
1000.0000 mL | Freq: Once | INTRAVENOUS | Status: AC
  Administered 2023-02-23: 1000 mL via INTRAVENOUS
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
## 2023-02-23 NOTE — Discharge Instructions (Signed)
Make sure that you are drinking plenty of fluid daily 6-8 8 ounce glasses which equals approximately 64 ounces of fluid.  He may also take Citrucel fiber for your dietary needs.  You may add in MiraLAX or milk of magnesia if you are having trouble making a bowel movement.  Please make sure to drink plenty of fluids at this time especially while you are recovering from surgery as it is easy to get dehydrated this is likely the reason that you felt dizzy earlier.  Return to the emergency department if you have worsening in your pain out of proportion to your recent surgery, fevers, chills, vomiting or lethargy/confusion.

## 2023-02-24 LAB — CULTURE, BLOOD (ROUTINE X 2)

## 2023-02-25 ENCOUNTER — Encounter (HOSPITAL_COMMUNITY): Payer: Self-pay

## 2023-02-25 ENCOUNTER — Emergency Department (HOSPITAL_COMMUNITY)
Admission: EM | Admit: 2023-02-25 | Discharge: 2023-02-25 | Disposition: A | Discharge status: Home / Self Care | Payer: BLUE CROSS/BLUE SHIELD | Attending: Emergency Medicine | Admitting: Emergency Medicine

## 2023-02-25 ENCOUNTER — Other Ambulatory Visit: Payer: Self-pay

## 2023-02-25 DIAGNOSIS — Z5189 Encounter for other specified aftercare: Secondary | ICD-10-CM

## 2023-02-25 DIAGNOSIS — Z4800 Encounter for change or removal of nonsurgical wound dressing: Secondary | ICD-10-CM | POA: Diagnosis present

## 2023-02-25 HISTORY — DX: Accidental discharge from unspecified firearms or gun, initial encounter: W34.00XA

## 2023-02-25 HISTORY — DX: Unspecified firearm discharge, undetermined intent, initial encounter: Y24.9XXA
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
## 2023-02-25 NOTE — Discharge Instructions (Signed)
Your wound appears to be healing appropriately, draining appropriately with no signs of overlying skin infection.  Please follow-up with the surgeon for follow-up and recheck as scheduled, and return to the emergency department if you develop fever, chills, concern for infection developing around the wound site

## 2023-03-04 ENCOUNTER — Other Ambulatory Visit: Payer: Self-pay

## 2023-03-04 ENCOUNTER — Encounter (HOSPITAL_COMMUNITY): Payer: Self-pay | Admitting: Emergency Medicine

## 2023-03-04 ENCOUNTER — Emergency Department (HOSPITAL_COMMUNITY)
Admission: EM | Admit: 2023-03-04 | Discharge: 2023-03-04 | Disposition: A | Discharge status: Home / Self Care | Payer: BLUE CROSS/BLUE SHIELD | Attending: Emergency Medicine | Admitting: Emergency Medicine

## 2023-03-04 DIAGNOSIS — Z4800 Encounter for change or removal of nonsurgical wound dressing: Secondary | ICD-10-CM | POA: Insufficient documentation

## 2023-03-04 DIAGNOSIS — Z5189 Encounter for other specified aftercare: Secondary | ICD-10-CM

## 2023-03-04 NOTE — ED Triage Notes (Signed)
Patient arrives ambulatory by POV c/o pain and irriatation to buttocks where he has a wound vac. States he has an appt Monday to have it removed.

## 2023-03-04 NOTE — Discharge Instructions (Addendum)
Your wound appears to be well-healing.  Please make sure you attend your appointment on Monday to get your drain removed.  If you have any worsening pain, fevers, swelling, please return to the nearest emergency department for reevaluation.  If you have any concerns, new or worsening symptoms, please return to the nearest emergency department for evaluation.
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
## 2023-03-08 ENCOUNTER — Ambulatory Visit
Admission: RE | Admit: 2023-03-08 | Discharge: 2023-03-08 | Disposition: A | Discharge status: Home / Self Care | Payer: BLUE CROSS/BLUE SHIELD | Source: Ambulatory Visit | Attending: Student | Admitting: Student

## 2023-03-08 ENCOUNTER — Ambulatory Visit: Admission: RE | Admit: 2023-03-08 | Payer: BLUE CROSS/BLUE SHIELD | Source: Ambulatory Visit

## 2023-03-08 ENCOUNTER — Ambulatory Visit
Admission: RE | Admit: 2023-03-08 | Discharge: 2023-03-08 | Disposition: A | Discharge status: Home / Self Care | Payer: BLUE CROSS/BLUE SHIELD | Source: Ambulatory Visit | Attending: Surgery | Admitting: Surgery

## 2023-03-08 DIAGNOSIS — T8140XA Infection following a procedure, unspecified, initial encounter: Secondary | ICD-10-CM

## 2023-03-08 HISTORY — PX: IR RADIOLOGIST EVAL & MGMT: IMG5224

## 2023-03-08 MED ORDER — IOPAMIDOL (ISOVUE-300) INJECTION 61%
100.0000 mL | Freq: Once | INTRAVENOUS | Status: AC | PRN
  Administered 2023-03-08: 100 mL via INTRAVENOUS
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
## 2024-01-23 ENCOUNTER — Emergency Department (HOSPITAL_BASED_OUTPATIENT_CLINIC_OR_DEPARTMENT_OTHER): Payer: Self-pay

## 2024-01-23 ENCOUNTER — Emergency Department (HOSPITAL_BASED_OUTPATIENT_CLINIC_OR_DEPARTMENT_OTHER)
Admission: EM | Admit: 2024-01-23 | Discharge: 2024-01-23 | Disposition: A | Discharge status: Home / Self Care | Payer: Self-pay | Attending: Emergency Medicine | Admitting: Emergency Medicine

## 2024-01-23 ENCOUNTER — Encounter (HOSPITAL_BASED_OUTPATIENT_CLINIC_OR_DEPARTMENT_OTHER): Payer: Self-pay | Admitting: Emergency Medicine

## 2024-01-23 DIAGNOSIS — R1084 Generalized abdominal pain: Secondary | ICD-10-CM

## 2024-01-23 DIAGNOSIS — R112 Nausea with vomiting, unspecified: Secondary | ICD-10-CM | POA: Insufficient documentation

## 2024-01-23 LAB — COMPREHENSIVE METABOLIC PANEL WITH GFR
ALT: 25 U/L (ref 0–44)
AST: 56 U/L — ABNORMAL HIGH (ref 15–41)
Albumin: 4.2 g/dL (ref 3.5–5.0)
Alkaline Phosphatase: 53 U/L (ref 38–126)
Anion gap: 11 (ref 5–15)
BUN: 12 mg/dL (ref 6–20)
CO2: 26 mmol/L (ref 22–32)
Calcium: 9.3 mg/dL (ref 8.9–10.3)
Chloride: 104 mmol/L (ref 98–111)
Creatinine, Ser: 0.93 mg/dL (ref 0.61–1.24)
GFR, Estimated: >60 mL/min (ref >60)
Glucose, Bld: 107 mg/dL — ABNORMAL HIGH (ref 70–99)
Potassium: 3.8 mmol/L (ref 3.5–5.1)
Sodium: 142 mmol/L (ref 135–145)
Total Bilirubin: 0.6 mg/dL (ref 0.0–1.2)
Total Protein: 7.3 g/dL (ref 6.5–8.1)

## 2024-01-23 LAB — URINALYSIS, W/ REFLEX TO CULTURE (INFECTION SUSPECTED)
Bacteria, UA: NONE SEEN
Bilirubin Urine: NEGATIVE
Glucose, UA: NEGATIVE mg/dL
Ketones, ur: NEGATIVE mg/dL
Leukocytes,Ua: NEGATIVE
Nitrite: NEGATIVE
Protein, ur: NEGATIVE mg/dL
Specific Gravity, Urine: 1.04 — ABNORMAL HIGH (ref 1.005–1.030)
pH: 8 (ref 5.0–8.0)

## 2024-01-23 LAB — LIPASE, BLOOD: Lipase: 11 U/L (ref 11–51)

## 2024-01-23 LAB — CBC WITH DIFFERENTIAL/PLATELET
Abs Immature Granulocytes: 0.02 10*3/uL (ref 0.00–0.07)
Basophils Absolute: 0.1 10*3/uL (ref 0.0–0.1)
Basophils Relative: 1 %
Eosinophils Absolute: 0.2 10*3/uL (ref 0.0–0.5)
Eosinophils Relative: 3 %
HCT: 44.3 % (ref 39.0–52.0)
Hemoglobin: 15 g/dL (ref 13.0–17.0)
Immature Granulocytes: 0 %
Lymphocytes Relative: 26 %
Lymphs Abs: 1.7 10*3/uL (ref 0.7–4.0)
MCH: 29.5 pg (ref 26.0–34.0)
MCHC: 33.9 g/dL (ref 30.0–36.0)
MCV: 87.2 fL (ref 80.0–100.0)
Monocytes Absolute: 0.5 10*3/uL (ref 0.1–1.0)
Monocytes Relative: 8 %
Neutro Abs: 4 10*3/uL (ref 1.7–7.7)
Neutrophils Relative %: 62 %
Platelets: 245 10*3/uL (ref 150–400)
RBC: 5.08 MIL/uL (ref 4.22–5.81)
RDW: 12.5 % (ref 11.5–15.5)
WBC: 6.4 10*3/uL (ref 4.0–10.5)
nRBC: 0 % (ref 0.0–0.2)

## 2024-01-23 MED ORDER — ONDANSETRON 4 MG PO TBDP: ORAL_TABLET | ORAL | 0 refills | Status: AC

## 2024-01-23 MED ORDER — IOHEXOL 300 MG/ML  SOLN
100.0000 mL | Freq: Once | INTRAMUSCULAR | Status: AC | PRN
  Administered 2024-01-23: 100 mL via INTRAVENOUS

## 2024-01-23 MED ORDER — ONDANSETRON HCL 4 MG/2ML IJ SOLN
4.0000 mg | Freq: Once | INTRAMUSCULAR | Status: AC
  Administered 2024-01-23: 4 mg via INTRAVENOUS
  Filled 2024-01-23: qty 2

## 2024-01-23 MED ORDER — HYDROMORPHONE HCL 1 MG/ML IJ SOLN
0.5000 mg | Freq: Once | INTRAMUSCULAR | Status: AC
  Administered 2024-01-23: 0.5 mg via INTRAVENOUS
  Filled 2024-01-23: qty 1

## 2024-01-23 MED ORDER — DICYCLOMINE HCL 10 MG/ML IM SOLN
20.0000 mg | Freq: Once | INTRAMUSCULAR | Status: AC
  Administered 2024-01-23: 20 mg via INTRAMUSCULAR
  Filled 2024-01-23: qty 2

## 2024-01-23 MED ORDER — SODIUM CHLORIDE 0.9 % IV BOLUS
1000.0000 mL | Freq: Once | INTRAVENOUS | Status: AC
  Administered 2024-01-23: 1000 mL via INTRAVENOUS

## 2024-01-23 MED ORDER — DICYCLOMINE HCL 20 MG PO TABS: 20.0000 mg | ORAL_TABLET | Freq: Two times a day (BID) | ORAL | 0 refills | Status: AC

## 2024-01-23 MED ORDER — MORPHINE SULFATE (PF) 4 MG/ML IV SOLN
4.0000 mg | Freq: Once | INTRAVENOUS | Status: AC
  Administered 2024-01-23: 4 mg via INTRAVENOUS
  Filled 2024-01-23: qty 1
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
## 2024-01-23 NOTE — ED Notes (Signed)
 Pt aware of the need for a urine... Unable to currently provide a sample.Marland KitchenMarland Kitchen
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
## 2024-01-23 NOTE — Discharge Instructions (Addendum)
 You had some inflammatory changes around your gallbladder.  Please make an appointment to follow-up with the gastroenterologist.  Return to the emergency room if you have any worsening symptoms.

## 2024-01-23 NOTE — ED Notes (Signed)
 DC paperwork given and verbally understood.... Pt aware of no drinking/driving due to the meds that were given... Pt AO x4... No SOB.Kerry AasAaron Harvey
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
## 2024-09-12 ENCOUNTER — Other Ambulatory Visit: Payer: Self-pay

## 2024-09-12 ENCOUNTER — Emergency Department (HOSPITAL_COMMUNITY)
Admission: EM | Admit: 2024-09-12 | Discharge: 2024-09-12 | Disposition: A | Discharge status: Home / Self Care | Payer: Self-pay | Attending: Emergency Medicine | Admitting: Emergency Medicine

## 2024-09-12 ENCOUNTER — Encounter (HOSPITAL_COMMUNITY): Payer: Self-pay

## 2024-09-12 DIAGNOSIS — T754XXA Electrocution, initial encounter: Secondary | ICD-10-CM | POA: Insufficient documentation

## 2024-09-12 DIAGNOSIS — R519 Headache, unspecified: Secondary | ICD-10-CM

## 2024-09-12 DIAGNOSIS — W868XXA Exposure to other electric current, initial encounter: Secondary | ICD-10-CM | POA: Insufficient documentation

## 2024-09-12 MED ORDER — ACETAMINOPHEN 500 MG PO TABS
1000.0000 mg | ORAL_TABLET | Freq: Once | ORAL | Status: AC
  Administered 2024-09-12: 1000 mg via ORAL
  Filled 2024-09-12: qty 2
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
## 2024-09-12 NOTE — ED Notes (Signed)
MD notified of patient arrival.
